# Patient Record
Sex: Female | Born: 1985 | Race: White | Hispanic: No | Marital: Single | Smoking: Current every day smoker
Health system: Southern US, Community
[De-identification: ages and names within clinical notes are randomized; demographics above are authoritative.]

## PROBLEM LIST (undated history)

## (undated) ENCOUNTER — Inpatient Hospital Stay: Payer: Self-pay

## (undated) DIAGNOSIS — J45909 Unspecified asthma, uncomplicated: Secondary | ICD-10-CM

## (undated) DIAGNOSIS — R51 Headache: Secondary | ICD-10-CM

## (undated) DIAGNOSIS — N3010 Interstitial cystitis (chronic) without hematuria: Secondary | ICD-10-CM

## (undated) DIAGNOSIS — F419 Anxiety disorder, unspecified: Secondary | ICD-10-CM

## (undated) DIAGNOSIS — O4400 Placenta previa specified as without hemorrhage, unspecified trimester: Secondary | ICD-10-CM

## (undated) DIAGNOSIS — D649 Anemia, unspecified: Secondary | ICD-10-CM

## (undated) DIAGNOSIS — R519 Headache, unspecified: Secondary | ICD-10-CM

## (undated) DIAGNOSIS — R011 Cardiac murmur, unspecified: Secondary | ICD-10-CM

## (undated) DIAGNOSIS — G2581 Restless legs syndrome: Secondary | ICD-10-CM

## (undated) HISTORY — PX: DILATION AND CURETTAGE OF UTERUS: SHX78

---
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
## 2006-01-26 ENCOUNTER — Emergency Department: Payer: Self-pay | Admitting: Emergency Medicine

## 2006-03-13 ENCOUNTER — Emergency Department: Payer: Self-pay | Admitting: Emergency Medicine

## 2006-09-16 ENCOUNTER — Emergency Department: Payer: Self-pay | Admitting: Unknown Physician Specialty

## 2007-02-10 ENCOUNTER — Other Ambulatory Visit: Payer: Self-pay

## 2007-02-10 ENCOUNTER — Emergency Department: Payer: Self-pay

## 2010-07-14 ENCOUNTER — Observation Stay: Payer: Self-pay | Admitting: Obstetrics and Gynecology

## 2010-07-15 ENCOUNTER — Inpatient Hospital Stay: Payer: Self-pay | Admitting: Obstetrics and Gynecology

## 2010-10-05 ENCOUNTER — Inpatient Hospital Stay: Payer: Self-pay

## 2010-10-14 ENCOUNTER — Emergency Department: Payer: Self-pay | Admitting: Emergency Medicine

## 2011-01-14 ENCOUNTER — Emergency Department: Payer: Self-pay | Admitting: Emergency Medicine

## 2011-09-04 ENCOUNTER — Emergency Department: Payer: Self-pay | Admitting: Emergency Medicine

## 2012-08-17 ENCOUNTER — Inpatient Hospital Stay: Payer: Self-pay

## 2012-08-17 DIAGNOSIS — O365990 Maternal care for other known or suspected poor fetal growth, unspecified trimester, not applicable or unspecified: Secondary | ICD-10-CM

## 2012-08-17 LAB — CBC WITH DIFFERENTIAL/PLATELET
Basophil %: 1.1 %
Eosinophil #: 0.4 10*3/uL (ref 0.0–0.7)
Lymphocyte %: 16.3 %
MCH: 26.7 pg (ref 26.0–34.0)
MCHC: 32.3 g/dL (ref 32.0–36.0)
Monocyte #: 1.3 x10 3/mm — ABNORMAL HIGH (ref 0.2–0.9)
Neutrophil #: 12.8 10*3/uL — ABNORMAL HIGH (ref 1.4–6.5)
RBC: 4.51 10*6/uL (ref 3.80–5.20)

## 2013-05-03 ENCOUNTER — Encounter (HOSPITAL_COMMUNITY): Payer: Self-pay

## 2013-05-03 ENCOUNTER — Emergency Department (HOSPITAL_COMMUNITY)
Admission: EM | Admit: 2013-05-03 | Discharge: 2013-05-03 | Disposition: A | Discharge status: Home / Self Care | Payer: Self-pay | Attending: Emergency Medicine | Admitting: Emergency Medicine

## 2013-05-03 DIAGNOSIS — F10920 Alcohol use, unspecified with intoxication, uncomplicated: Secondary | ICD-10-CM

## 2013-05-03 DIAGNOSIS — F1010 Alcohol abuse, uncomplicated: Secondary | ICD-10-CM | POA: Insufficient documentation

## 2013-05-03 DIAGNOSIS — N3010 Interstitial cystitis (chronic) without hematuria: Secondary | ICD-10-CM | POA: Insufficient documentation

## 2013-05-03 DIAGNOSIS — E162 Hypoglycemia, unspecified: Secondary | ICD-10-CM | POA: Insufficient documentation

## 2013-05-03 HISTORY — DX: Interstitial cystitis (chronic) without hematuria: N30.10

## 2013-05-03 MED ORDER — ONDANSETRON 8 MG PO TBDP
8.0000 mg | ORAL_TABLET | Freq: Once | ORAL | Status: AC
  Administered 2013-05-03: 8 mg via ORAL
  Filled 2013-05-03: qty 1

## 2013-05-03 NOTE — ED Provider Notes (Signed)
History    CSN: [id number] Arrival date & time 05/03/13  0865  First MD Initiated Contact with Patient 05/03/13 0344     Chief Complaint  Patient presents with  . Alcohol Intoxication   (Consider location/radiation/quality/duration/timing/severity/associated sxs/prior Treatment) HPI  Tokiko Diefenderfer is a 27 y.o. female brought in by EMS or patient was unable to stand up or open her eyes. CBG was 54 in the field. Patient states that she's had approximately 6 beers in 2-3 shots she has not eaten today. She normally does not drink alcohol. She endorses nausea, denies pain, emesis. Patient is AxOx3   Past Medical History  Diagnosis Date  . Interstitial cystitis    History reviewed. No pertinent past surgical history. History reviewed. No pertinent family history. History  Substance Use Topics  . Smoking status: Not on file  . Smokeless tobacco: Not on file  . Alcohol Use: Yes   OB History   Grav Para Term Preterm Abortions TAB SAB Ect Mult Living                 Review of Systems  Constitutional:       Negative except as described in HPI  HENT:       Negative except as described in HPI  Respiratory:       Negative except as described in HPI  Cardiovascular:       Negative except as described in HPI  Gastrointestinal:       Negative except as described in HPI  Genitourinary:       Negative except as described in HPI  Musculoskeletal:       Negative except as described in HPI  Skin:       Negative except as described in HPI  Neurological:       Negative except as described in HPI  All other systems reviewed and are negative.    Allergies  Amoxicillin  Home Medications  No current outpatient prescriptions on file. LMP 05/03/2013 Physical Exam  Nursing note and vitals reviewed. Constitutional: She is oriented to person, place, and time. She appears well-developed and well-nourished. No distress.  Appears disheveled, alert and oriented x3, no slurring of  speech.  HENT:  Head: Normocephalic and atraumatic.  Mouth/Throat: Oropharynx is clear and moist.  Eyes: Conjunctivae and EOM are normal. Pupils are equal, round, and reactive to light.  Neck: Normal range of motion. Neck supple.  No midline tenderness to palpation or step-offs appreciated. Patient has full range of motion without pain.    Cardiovascular: Normal rate, regular rhythm and intact distal pulses.   Pulmonary/Chest: Effort normal and breath sounds normal. No stridor. No respiratory distress. She has no wheezes. She has no rales. She exhibits no tenderness.  Abdominal: Soft. Bowel sounds are normal. She exhibits no distension and no mass. There is no tenderness. There is no rebound and no guarding.  Musculoskeletal: Normal range of motion. She exhibits no edema.  Neurological: She is alert and oriented to person, place, and time.  Psychiatric: She has a normal mood and affect.    ED Course  Procedures (including critical care time) Labs Reviewed - No data to display No results found. 1. Alcohol intoxication, uncomplicated   2. Hypoglycemia     MDM   Filed Vitals:   05/03/13 0359  Temp: 97.5 F (36.4 C)  TempSrc: Oral     Leimomi Zervas is a 27 y.o. female with hypoglycemia and alcohol intoxication. Patient is coherent, ambulates with a  coordinated gait. Sugar is normalized at this point, politely requesting to be discharged to home. Does not appear to be a threat to herself or others. Her sister will be coming to pick her up  Medications  ondansetron (ZOFRAN-ODT) disintegrating tablet 8 mg (8 mg Oral Given 05/03/13 0358)    Pt is hemodynamically stable, appropriate for, and amenable to discharge at this time. Pt verbalized understanding and agrees with care plan. Outpatient follow-up and specific return precautions discussed.     Joni Reining Ermine Spofford, PA-C 05/03/13 0403

## 2013-05-03 NOTE — ED Provider Notes (Signed)
Medical screening examination/treatment/procedure(s) were performed by non-physician practitioner and as supervising physician I was immediately available for consultation/collaboration.  Lyanne Co, MD 05/03/13 [phone]

## 2013-05-03 NOTE — ED Notes (Signed)
Bed:WA18<BR> Expected date:<BR> Expected time:<BR> Means of arrival:<BR> Comments:<BR> EMS

## 2013-05-03 NOTE — ED Notes (Signed)
Pt was at [facility] and was unable to stand up or open her eyes, the bouncers called [employer] and they called EMS, pt states that she's from out of town and hasn't eaten all day. Per EMS CBG was 54

## 2013-07-31 ENCOUNTER — Emergency Department: Payer: Self-pay | Admitting: Emergency Medicine

## 2013-09-12 ENCOUNTER — Emergency Department: Payer: Self-pay | Admitting: Emergency Medicine

## 2013-09-12 LAB — URINALYSIS, COMPLETE
Ketone: NEGATIVE
Specific Gravity: 1.017 (ref 1.003–1.030)
Squamous Epithelial: 7
WBC UR: 359 /HPF (ref 0–5)

## 2013-09-15 LAB — URINE CULTURE

## 2013-09-16 ENCOUNTER — Emergency Department: Payer: Self-pay | Admitting: Emergency Medicine

## 2013-09-17 LAB — CBC
HCT: 38.3 % (ref 35.0–47.0)
HGB: 12.5 g/dL (ref 12.0–16.0)
MCH: 25.8 pg — ABNORMAL LOW (ref 26.0–34.0)
MCHC: 32.7 g/dL (ref 32.0–36.0)
Platelet: 403 10*3/uL (ref 150–440)
WBC: 16 10*3/uL — ABNORMAL HIGH (ref 3.6–11.0)

## 2013-09-17 LAB — COMPREHENSIVE METABOLIC PANEL
Albumin: 4 g/dL (ref 3.4–5.0)
Alkaline Phosphatase: 89 U/L (ref 50–136)
Anion Gap: 3 — ABNORMAL LOW (ref 7–16)
BUN: 9 mg/dL (ref 7–18)
Bilirubin,Total: 0.2 mg/dL (ref 0.2–1.0)
Creatinine: 0.94 mg/dL (ref 0.60–1.30)
EGFR (African American): >60
Potassium: 3.5 mmol/L (ref 3.5–5.1)
Sodium: 137 mmol/L (ref 136–145)

## 2013-09-17 LAB — URINALYSIS, COMPLETE
Bilirubin,UR: NEGATIVE
Blood: NEGATIVE
Glucose,UR: NEGATIVE mg/dL (ref 0–75)
Leukocyte Esterase: NEGATIVE
Specific Gravity: 1.017 (ref 1.003–1.030)
WBC UR: 4 /HPF (ref 0–5)

## 2013-09-18 LAB — URINE CULTURE

## 2014-01-17 ENCOUNTER — Emergency Department: Payer: Self-pay | Admitting: Emergency Medicine

## 2014-01-17 LAB — URINALYSIS, COMPLETE
Bilirubin,UR: NEGATIVE
KETONE: NEGATIVE
LEUKOCYTE ESTERASE: NEGATIVE
Nitrite: POSITIVE
Ph: 5 (ref 4.5–8.0)
Protein: 100
RBC,UR: 429 /HPF (ref 0–5)
Specific Gravity: 1.015 (ref 1.003–1.030)
WBC UR: 509 /HPF (ref 0–5)

## 2014-01-20 LAB — URINE CULTURE

## 2014-08-08 ENCOUNTER — Emergency Department: Payer: Self-pay | Admitting: Emergency Medicine

## 2014-08-08 LAB — URINALYSIS, COMPLETE
Bilirubin,UR: NEGATIVE
Glucose,UR: NEGATIVE mg/dL (ref 0–75)
KETONE: NEGATIVE
LEUKOCYTE ESTERASE: NEGATIVE
NITRITE: POSITIVE
PROTEIN: NEGATIVE
Ph: 6 (ref 4.5–8.0)
Specific Gravity: 1.019 (ref 1.003–1.030)
Squamous Epithelial: 4

## 2014-08-10 LAB — URINE CULTURE

## 2014-10-30 ENCOUNTER — Emergency Department: Payer: Self-pay | Admitting: Emergency Medicine

## 2014-10-31 ENCOUNTER — Emergency Department: Payer: Self-pay | Admitting: Emergency Medicine

## 2015-02-23 NOTE — Discharge Summary (Signed)
PATIENT NAME:  Jordan [name], Jordan [name] MR#:  [id number] DATE OF BIRTH:  20-Feb-1986  DATE OF ADMISSION:  08/17/2012 DATE OF DISCHARGE:  08/20/2012  HOSPITAL COURSE: The patient underwent elective repeat Cesarean section which was uncomplicated. Postop day #1 hematocrit 27.2%. She is discharged to home on postop day #3 in stable condition. She will follow-up with Dr. Feliberto GottronSchermerhorn in two weeks in the clinic.   DISCHARGE MEDICATIONS:  1. Norco. 2. Ibuprofen. 3. Iron. 4. Prenatal vitamins.   DISCHARGE INSTRUCTIONS: She is given precautions to return before two weeks if she has wound drainage, fever, nausea, or vomiting.   ____________________________ Suzy Bouchardhomas J. Terrance Lanahan, MD tjs:drc [name]: 08/23/2012 08:23:36 ET T: 08/23/2012 11:22:14 ET JOB#: [id number]  cc: Suzy Bouchardhomas J. Shamell Suarez, MD, <Dictator> Suzy BouchardHOMAS J Tylasia Fletchall MD ELECTRONICALLY SIGNED 08/24/2012 10:30

## 2015-02-23 NOTE — Op Note (Signed)
PATIENT NAME:  Jordan [name], Jordan [name] MR#:  [id number] DATE OF BIRTH:  [birth year]  DATE OF PROCEDURE:  08/17/2012  PREOPERATIVE DIAGNOSES:  1. Labor.  2. Previous cesarean section.   POSTOPERATIVE DIAGNOSES: 1. Labor.  2. Previous cesarean section.   PROCEDURE: Repeat low transverse cesarean section.   SURGEON: Suzy Bouchardhomas J. Schermerhorn, [name].   FIRST ASSISTANT: Yetta BarreJones  ANESTHESIA: Spinal.   INDICATIONS: This is a 29 year old gravida 3, para 1, a patient at 8037 weeks estimated gestational age who came in in active labor. The patient elects for repeat cesarean section.   DESCRIPTION OF PROCEDURE: After adequate spinal anesthesia, the patient was placed in the dorsal supine position with a hip roll under the right side. The patient's abdomen was prepped and draped in normal sterile fashion. A Pfannenstiel incision was made two fingerbreadths above the symphysis pubis. Sharp dissection was used to identify the fascia. The fascia was opened in the midline and opened in a transverse fashion. The superior aspect of the fascia was grasped with Kocher clamps and the recti muscles dissected free. The inferior aspect of the fascia was grasped with Kocher clamps and pyramidalis muscle was dissected free. Entry into the peritoneal cavity was accomplished sharply. The vesicouterine peritoneal fold was identified. A bladder flap was created and the bladder was reflected inferiorly. A low transverse uterine incision was made. Upon entry into the endometrial cavity, clear fluid resulted. The incision was extended with blunt transverse traction. Fetal head was delivered through the incision and body was delivered without difficulty. The cord was doubly clamped. A vigorous small female was passed to Dr. Beckie Saltsasnadi who assigned Apgar scores of 9 and 9. Placenta was manually delivered and the uterus was exteriorized and the endometrial cavity was wiped clean with laparotomy tape. The cervix was opened with a ring forceps and this  was passed off the operative field. The uterine incision was then closed with one chromic suture in a running locking fashion with good approximation of edges. Good hemostasis was noted. The fallopian tubes and ovaries appeared normal. The posterior cul-de-sac was irrigated and suctioned. The uterus was placed back into the abdominal cavity. Again, the uterine incision appeared hemostatic. Interceed was placed over the uterine incision, in a T-shaped fashion. The superior aspect of the fascia was grasped with Kocher clamps and the On-Q pump system was brought up to the operative field. Catheters were inserted infraumbilically and subfascially. The fascia was then closed over top of the catheters with 0 Vicryl suture. Subcutaneous tissues were irrigated and bovied for hemostasis. The skin was reapproximated with staples. On-Q pump catheters were secured with Dermabond, at the skin level, and Steri-Strips applied to the catheters and Tegaderm applied above this. Each catheter was loaded with 5 mL of 0.5% Marcaine. There were no complications. Estimated blood loss 500 mL. Intraoperative fluids 1500 mL. The patient tolerated the procedure well and was taken to the recovery room in good condition. ____________________________ Suzy Bouchardhomas J. Schermerhorn, MD tjs:slb [name]: 08/17/2012 15:05:30 ET T: 08/17/2012 15:19:19 ET JOB#: [id number]  cc: Suzy Bouchardhomas J. Schermerhorn, MD, <Dictator> Suzy BouchardHOMAS J SCHERMERHORN MD ELECTRONICALLY SIGNED 08/19/2012 9:27

## 2015-03-16 NOTE — H&P (Signed)
L&D Evaluation:  History:   HPI 29 yo G3P1011 with LMP of 09/08/12 by [place] at 28 weeks with late entry to care at 28 weeks, Prev C-section due to cord prolapse and smoker 10 cigs a day. GC/CH is not on current chart. UC's began this am and pt had cx exam yest and was closed in the office by CJones, cNm. Today, she is 3-4 cms in active labor. No ROM, VB or decreased FM. Pt is crying due to IV being started.    Presents with contractions    Patient's Medical History Pre-ecclampsia with Primary C-section at 39 weeks, asthma,    Patient's Surgical History Previous C-Section    Medications Pre Natal Vitamins    Allergies Amoxicillen    Social History tobacco  8-10 scale    Family History Non-Contributory   ROS:   ROS All systems were reviewed.  HEENT, CNS, GI, GU, Respiratory, CV, Renal and Musculoskeletal systems were found to be normal.   Exam:   Vital Signs stable    General no apparent distress    Mental Status clear    Chest clear    Heart no murmur/gallop/rubs    Abdomen gravid, non-tender    Estimated Fetal Weight Small for gestational age    Back no CVAT    Edema 1+    Reflexes 1+    Pelvic 3-4 cms    Mebranes Intact    FHT normal rate with no decels, 1 decel to 80 x 20 secs    Ucx regular    Skin dry    Lymph no lymphadenopathy   Impression:   Impression active labor   Plan:   Plan monitor contractions and for cervical change    Comments Planning to call a C-section however, another pt on the unit is having decels. May give Terb if needed.   Electronic Signatures: Sharee PimpleJones, Rease Swinson W (CNM)  (Signed 12-Oct-13 13:33)  Authored: L&D Evaluation   Last Updated: 12-Oct-13 13:33 by Sharee PimpleJones, Sheala Dosh W (CNM)

## 2015-07-22 ENCOUNTER — Emergency Department
Admission: EM | Admit: 2015-07-22 | Discharge: 2015-07-22 | Disposition: A | Discharge status: Home / Self Care | Payer: Medicaid Other | Source: Home / Self Care

## 2015-07-22 ENCOUNTER — Encounter: Payer: Self-pay | Admitting: Emergency Medicine

## 2015-07-22 ENCOUNTER — Emergency Department
Admission: EM | Admit: 2015-07-22 | Discharge: 2015-07-22 | Disposition: A | Discharge status: Home / Self Care | Payer: Medicaid Other | Attending: Emergency Medicine | Admitting: Emergency Medicine

## 2015-07-22 DIAGNOSIS — O210 Mild hyperemesis gravidarum: Secondary | ICD-10-CM | POA: Diagnosis not present

## 2015-07-22 DIAGNOSIS — Z3A08 8 weeks gestation of pregnancy: Secondary | ICD-10-CM | POA: Insufficient documentation

## 2015-07-22 DIAGNOSIS — F17210 Nicotine dependence, cigarettes, uncomplicated: Secondary | ICD-10-CM | POA: Insufficient documentation

## 2015-07-22 DIAGNOSIS — Z3491 Encounter for supervision of normal pregnancy, unspecified, first trimester: Secondary | ICD-10-CM

## 2015-07-22 DIAGNOSIS — O99331 Smoking (tobacco) complicating pregnancy, first trimester: Secondary | ICD-10-CM | POA: Insufficient documentation

## 2015-07-22 DIAGNOSIS — R102 Pelvic and perineal pain: Secondary | ICD-10-CM | POA: Diagnosis not present

## 2015-07-22 DIAGNOSIS — O9989 Other specified diseases and conditions complicating pregnancy, childbirth and the puerperium: Secondary | ICD-10-CM | POA: Diagnosis present

## 2015-07-22 DIAGNOSIS — Z880 Allergy status to penicillin: Secondary | ICD-10-CM | POA: Insufficient documentation

## 2015-07-22 LAB — URINALYSIS COMPLETE WITH MICROSCOPIC (ARMC ONLY)
Bilirubin Urine: NEGATIVE
GLUCOSE, UA: NEGATIVE mg/dL
HGB URINE DIPSTICK: NEGATIVE
KETONES UR: NEGATIVE mg/dL
NITRITE: NEGATIVE
Protein, ur: 30 mg/dL — AB
SPECIFIC GRAVITY, URINE: 1.024 (ref 1.005–1.030)
pH: 6 (ref 5.0–8.0)

## 2015-07-22 LAB — COMPREHENSIVE METABOLIC PANEL
ALT: 17 U/L (ref 14–54)
ANION GAP: 9 (ref 5–15)
AST: 21 U/L (ref 15–41)
Albumin: 4 g/dL (ref 3.5–5.0)
Alkaline Phosphatase: 50 U/L (ref 38–126)
BILIRUBIN TOTAL: 0.4 mg/dL (ref 0.3–1.2)
BUN: 8 mg/dL (ref 6–20)
CALCIUM: 9.3 mg/dL (ref 8.9–10.3)
CO2: 21 mmol/L — ABNORMAL LOW (ref 22–32)
Chloride: 105 mmol/L (ref 101–111)
Creatinine, Ser: 0.59 mg/dL (ref 0.44–1.00)
GFR calc Af Amer: >60 mL/min (ref >60)
Glucose, Bld: 92 mg/dL (ref 65–99)
POTASSIUM: 3.5 mmol/L (ref 3.5–5.1)
Sodium: 135 mmol/L (ref 135–145)
TOTAL PROTEIN: 7.4 g/dL (ref 6.5–8.1)

## 2015-07-22 LAB — CBC
HEMATOCRIT: 35.3 % (ref 35.0–47.0)
HEMOGLOBIN: 11.6 g/dL — AB (ref 12.0–16.0)
MCH: 27.1 pg (ref 26.0–34.0)
MCHC: 32.9 g/dL (ref 32.0–36.0)
MCV: 82.5 fL (ref 80.0–100.0)
Platelets: 227 10*3/uL (ref 150–440)
RBC: 4.27 MIL/uL (ref 3.80–5.20)
RDW: 13 % (ref 11.5–14.5)
WBC: 11.5 10*3/uL — AB (ref 3.6–11.0)

## 2015-07-22 LAB — POCT PREGNANCY, URINE: PREG TEST UR: POSITIVE — AB

## 2015-07-22 LAB — HCG, QUANTITATIVE, PREGNANCY: hCG, Beta Chain, Quant, S: 242265 m[IU]/mL — ABNORMAL HIGH (ref <5)

## 2015-07-22 MED ORDER — PRENATAL VITAMINS 0.8 MG PO TABS: 1.0000 | ORAL_TABLET | Freq: Every day | ORAL | Status: DC

## 2015-07-22 MED ORDER — PROMETHAZINE HCL 25 MG PO TABS: 12.5000 mg | ORAL_TABLET | Freq: Four times a day (QID) | ORAL | Status: DC | PRN

## 2015-07-22 NOTE — ED Notes (Signed)
Pt states she has been shaky with headache and dizziness, states "I feel like my throat is closing", also states she has been having some lower abd. Pain with nausea, states abd. Pain started 2 weeks ago, denies any urinary symptoms

## 2015-07-22 NOTE — Discharge Instructions (Signed)
Your evaluation in the ER today reveals that you are pregnant.  Your last menstrual period, you are 8 weeks and 2 days along. Your pregnancy hormone level today is [phone].  Please follow-up with the health Department or your obstetrician to establish prenatal care and take prenatal vitamins.  First Trimester of Pregnancy The first trimester of pregnancy is from week 1 until the end of week 12 (months 1 through 3). A week after a sperm fertilizes an egg, the egg will implant on the wall of the uterus. This embryo will begin to develop into a baby. Genes from you and your partner are forming the baby. The female genes determine whether the baby is a boy or a girl. At 6-8 weeks, the eyes and face are formed, and the heartbeat can be seen on ultrasound. At the end of 12 weeks, all the baby's organs are formed.  Now that you are pregnant, you will want to do everything you can to have a healthy baby. Two of the most important things are to get good prenatal care and to follow your health care provider's instructions. Prenatal care is all the medical care you receive before the baby's birth. This care will help prevent, find, and treat any problems during the pregnancy and childbirth. BODY CHANGES Your body goes through many changes during pregnancy. The changes vary from woman to woman.   You may gain or lose a couple of pounds at first.  You may feel sick to your stomach (nauseous) and throw up (vomit). If the vomiting is uncontrollable, call your health care provider.  You may tire easily.  You may develop headaches that can be relieved by medicines approved by your health care provider.  You may urinate more often. Painful urination may mean you have a bladder infection.  You may develop heartburn as a result of your pregnancy.  You may develop constipation because certain hormones are causing the muscles that push waste through your intestines to slow down.  You may develop hemorrhoids or  swollen, bulging veins (varicose veins).  Your breasts may begin to grow larger and become tender. Your nipples may stick out more, and the tissue that surrounds them (areola) may become darker.  Your gums may bleed and may be sensitive to brushing and flossing.  Dark spots or blotches (chloasma, mask of pregnancy) may develop on your face. This will likely fade after the baby is born.  Your menstrual periods will stop.  You may have a loss of appetite.  You may develop cravings for certain kinds of food.  You may have changes in your emotions from day to day, such as being excited to be pregnant or being concerned that something may go wrong with the pregnancy and baby.  You may have more vivid and strange dreams.  You may have changes in your hair. These can include thickening of your hair, rapid growth, and changes in texture. Some women also have hair loss during or after pregnancy, or hair that feels dry or thin. Your hair will most likely return to normal after your baby is born. WHAT TO EXPECT AT YOUR PRENATAL VISITS During a routine prenatal visit:  You will be weighed to make sure you and the baby are growing normally.  Your blood pressure will be taken.  Your abdomen will be measured to track your baby's growth.  The fetal heartbeat will be listened to starting around week 10 or 12 of your pregnancy.  Test results from any previous  visits will be discussed. Your health care provider may ask you:  How you are feeling.  If you are feeling the baby move.  If you have had any abnormal symptoms, such as leaking fluid, bleeding, severe headaches, or abdominal cramping.  If you have any questions. Other tests that may be performed during your first trimester include:  Blood tests to find your blood type and to check for the presence of any previous infections. They will also be used to check for low iron levels (anemia) and Rh antibodies. Later in the pregnancy, blood  tests for diabetes will be done along with other tests if problems develop.  Urine tests to check for infections, diabetes, or protein in the urine.  An ultrasound to confirm the proper growth and development of the baby.  An amniocentesis to check for possible genetic problems.  Fetal screens for spina bifida and Down syndrome.  You may need other tests to make sure you and the baby are doing well. HOME CARE INSTRUCTIONS  Medicines  Follow your health care provider's instructions regarding medicine use. Specific medicines may be either safe or unsafe to take during pregnancy.  Take your prenatal vitamins as directed.  If you develop constipation, try taking a stool softener if your health care provider approves. Diet  Eat regular, well-balanced meals. Choose a variety of foods, such as meat or vegetable-based protein, fish, milk and low-fat dairy products, vegetables, fruits, and whole grain breads and cereals. Your health care provider will help you determine the amount of weight gain that is right for you.  Avoid raw meat and uncooked cheese. These carry germs that can cause birth defects in the baby.  Eating four or five small meals rather than three large meals a day may help relieve nausea and vomiting. If you start to feel nauseous, eating a few soda crackers can be helpful. Drinking liquids between meals instead of during meals also seems to help nausea and vomiting.  If you develop constipation, eat more high-fiber foods, such as fresh vegetables or fruit and whole grains. Drink enough fluids to keep your urine clear or pale yellow. Activity and Exercise  Exercise only as directed by your health care provider. Exercising will help you:  Control your weight.  Stay in shape.  Be prepared for labor and delivery.  Experiencing pain or cramping in the lower abdomen or low back is a good sign that you should stop exercising. Check with your health care provider before  continuing normal exercises.  Try to avoid standing for long periods of time. Move your legs often if you must stand in one place for a long time.  Avoid heavy lifting.  Wear low-heeled shoes, and practice good posture.  You may continue to have sex unless your health care provider directs you otherwise. Relief of Pain or Discomfort  Wear a good support bra for breast tenderness.   Take warm sitz baths to soothe any pain or discomfort caused by hemorrhoids. Use hemorrhoid cream if your health care provider approves.   Rest with your legs elevated if you have leg cramps or low back pain.  If you develop varicose veins in your legs, wear support hose. Elevate your feet for 15 minutes, 3-4 times a day. Limit salt in your diet. Prenatal Care  Schedule your prenatal visits by the twelfth week of pregnancy. They are usually scheduled monthly at first, then more often in the last 2 months before delivery.  Write down your questions. Take them  to your prenatal visits.  Keep all your prenatal visits as directed by your health care provider. Safety  Wear your seat belt at all times when driving.  Make a list of emergency phone numbers, including numbers for family, friends, the hospital, and police and fire departments. General Tips  Ask your health care provider for a referral to a local prenatal education class. Begin classes no later than at the beginning of month 6 of your pregnancy.  Ask for help if you have counseling or nutritional needs during pregnancy. Your health care provider can offer advice or refer you to specialists for help with various needs.  Do not use hot tubs, steam rooms, or saunas.  Do not douche or use tampons or scented sanitary pads.  Do not cross your legs for long periods of time.  Avoid cat litter boxes and soil used by cats. These carry germs that can cause birth defects in the baby and possibly loss of the fetus by miscarriage or stillbirth.  Avoid  all smoking, herbs, alcohol, and medicines not prescribed by your health care provider. Chemicals in these affect the formation and growth of the baby.  Schedule a dentist appointment. At home, brush your teeth with a soft toothbrush and be gentle when you floss. SEEK MEDICAL CARE IF:   You have dizziness.  You have mild pelvic cramps, pelvic pressure, or nagging pain in the abdominal area.  You have persistent nausea, vomiting, or diarrhea.  You have a bad smelling vaginal discharge.  You have pain with urination.  You notice increased swelling in your face, hands, legs, or ankles. SEEK IMMEDIATE MEDICAL CARE IF:   You have a fever.  You are leaking fluid from your vagina.  You have spotting or bleeding from your vagina.  You have severe abdominal cramping or pain.  You have rapid weight gain or loss.  You vomit blood or material that looks like coffee grounds.  You are exposed to [place] measles and have never had them.  You are exposed to fifth disease or chickenpox.  You develop a severe headache.  You have shortness of breath.  You have any kind of trauma, such as from a fall or a car accident. Document Released: 10/17/2001 Document Revised: 03/09/2014 Document Reviewed: 09/02/2013 [facility] Patient Information 2015 [place], [place]. This information is not intended to replace advice given to you by your health care provider. Make sure you discuss any questions you have with your health care provider.  Morning Sickness Morning sickness is when you feel sick to your stomach (nauseous) during pregnancy. This nauseous feeling may or may not come with vomiting. It often occurs in the morning but can be a problem any time of day. Morning sickness is most common during the first trimester, but it may continue throughout pregnancy. While morning sickness is unpleasant, it is usually harmless unless you develop severe and continual vomiting (hyperemesis gravidarum). This  condition requires more intense treatment.  CAUSES  The cause of morning sickness is not completely known but seems to be related to normal hormonal changes that occur in pregnancy. RISK FACTORS You are at greater risk if you:  Experienced nausea or vomiting before your pregnancy.  Had morning sickness during a previous pregnancy.  Are pregnant with more than one baby, such as twins. TREATMENT  Do not use any medicines (prescription, over-the-counter, or herbal) for morning sickness without first talking to your health care provider. Your health care provider may prescribe or recommend:  Vitamin B6  supplements.  Anti-nausea medicines.  The herbal medicine ginger. HOME CARE INSTRUCTIONS   Only take over-the-counter or prescription medicines as directed by your health care provider.  Taking multivitamins before getting pregnant can prevent or decrease the severity of morning sickness in most women.  Eat a piece of dry toast or unsalted crackers before getting out of bed in the morning.  Eat five or six small meals a day.  Eat dry and bland foods (rice, baked potato). Foods high in carbohydrates are often helpful.  Do not drink liquids with your meals. Drink liquids between meals.  Avoid greasy, fatty, and spicy foods.  Get someone to cook for you if the smell of any food causes nausea and vomiting.  If you feel nauseous after taking prenatal vitamins, take the vitamins at night or with a snack.  Snack on protein foods (nuts, yogurt, cheese) between meals if you are hungry.  Eat unsweetened gelatins for desserts.  Wearing an acupressure wristband (worn for sea sickness) may be helpful.  Acupuncture may be helpful.  Do not smoke.  Get a humidifier to keep the air in your house free of odors.  Get plenty of fresh air. SEEK MEDICAL CARE IF:   Your home remedies are not working, and you need medicine.  You feel dizzy or lightheaded.  You are losing weight. SEEK  IMMEDIATE MEDICAL CARE IF:   You have persistent and uncontrolled nausea and vomiting.  You pass out (faint). MAKE SURE YOU:  Understand these instructions.  Will watch your condition.  Will get help right away if you are not doing well or get worse. Document Released: 12/14/2006 Document Revised: 10/28/2013 Document Reviewed: 04/09/2013 [facility] Patient Information 2015 [place], [place]. This information is not intended to replace advice given to you by your health care provider. Make sure you discuss any questions you have with your health care provider.

## 2015-07-22 NOTE — ED Notes (Signed)
Pt states nausea, dizziness and weakness, states yesterday she had a headache and some left arm numbness, pt awake and alert in no distress ambulatory to room

## 2015-07-22 NOTE — ED Notes (Signed)
Pt 4 times in waiting room, no answer

## 2015-07-22 NOTE — ED Notes (Signed)
Pt returned back to ED after leaving, pt not having any shakiness or anxiety at this time but still having lower abd. pain

## 2015-07-22 NOTE — ED Provider Notes (Signed)
[facility] Emergency Department Provider Note  ____________________________________________  Time seen: 6:30 PM  I have reviewed the triage vital signs and the nursing notes.   HISTORY  Chief Complaint Abdominal Pain    HPI Jordan [name] is a 29 y.o. female who complains of nausea vomiting and dizziness over the past few days. This seemed to come in and over. Denies any recent illness. No chest pain shortness breath fever chills. No vomiting or diarrhea. Last menstrual. Was 05/25/2015.  Denies dysuria frequency urgency. No vaginal bleeding or discharge. Complains of some mild suprapubic discomfort. Nonradiating   Past Medical History  Diagnosis Date  . Interstitial cystitis      There are no active problems to display for this patient.    Past Surgical History  Procedure Laterality Date  . Cesarean section       Current Outpatient Rx  Name  Route  Sig  Dispense  Refill  . Prenatal Multivit-Min-Fe-FA (PRENATAL VITAMINS) 0.8 MG tablet   Oral   Take 1 tablet by mouth daily.   10 tablet   0   . promethazine (PHENERGAN) 25 MG tablet   Oral   Take 0.5 tablets (12.5 mg total) by mouth every 6 (six) hours as needed for nausea or vomiting.   10 tablet   0      Allergies Amoxicillin and Vicodin   No family history on file.  Social History Social History  Substance Use Topics  . Smoking status: Current Every Day Smoker    Types: Cigarettes  . Smokeless tobacco: None  . Alcohol Use: No    Review of Systems  Constitutional:   No fever or chills. No weight changes Eyes:   No blurry vision or double vision.  ENT:   No sore throat. Cardiovascular:   No chest pain. Respiratory:   No dyspnea or cough. Gastrointestinal:   Mild pelvic pain with nausea, no vomiting or diarrhea.  No BRBPR or melena. Genitourinary:   Negative for dysuria, urinary retention, bloody urine, or difficulty urinating. Musculoskeletal:   Negative for back  pain. No joint swelling or pain. Skin:   Negative for rash. Neurological:   Negative for headaches, focal weakness or numbness. Psychiatric:  No anxiety or depression.   Endocrine:  No hot/cold intolerance, changes in energy, or sleep difficulty.  10-point ROS otherwise negative.  ____________________________________________   PHYSICAL EXAM:  VITAL SIGNS: ED Triage Vitals  Enc Vitals Group     BP 07/22/15 1815 115/76 mmHg     Pulse Rate 07/22/15 1815 84     Resp 07/22/15 1815 18     Temp 07/22/15 1815 98.1 F (36.7 C)     Temp Source 07/22/15 1815 Oral     SpO2 07/22/15 1815 98 %     Weight 07/22/15 1815 98 lb (44.453 kg)     Height 07/22/15 1815 5\' 4"  (1.626 m)     Head Cir --      Peak Flow --      Pain Score 07/22/15 1816 6     Pain Loc --      Pain Edu? --      Excl. in GC? --      Constitutional:   Alert and oriented. Well appearing and in no distress. Eyes:   No scleral icterus. No conjunctival pallor. PERRL. EOMI ENT   Head:   Normocephalic and atraumatic.   Nose:   No congestion/rhinnorhea. No septal hematoma   Mouth/Throat:   MMM, no  pharyngeal erythema. No peritonsillar mass. No uvula shift.   Neck:   No stridor. No SubQ emphysema. No meningismus. Hematological/Lymphatic/Immunilogical:   No cervical lymphadenopathy. Cardiovascular:   RRR. Normal and symmetric distal pulses are present in all extremities. No murmurs, rubs, or gallops. Respiratory:   Normal respiratory effort without tachypnea nor retractions. Breath sounds are clear and equal bilaterally. No wheezes/rales/rhonchi. Gastrointestinal:   Soft and nontender. No distention. There is no CVA tenderness.  No rebound, rigidity, or guarding.uterus nonpalpable, nongravid by exam Genitourinary:   deferred Musculoskeletal:   Nontender with normal range of motion in all extremities. No joint effusions.  No lower extremity tenderness.  No edema. Neurologic:   Normal speech and language.  CN  2-10 normal. Motor grossly intact. No pronator drift.  Normal gait. No gross focal neurologic deficits are appreciated.  Skin:    Skin is warm, dry and intact. No rash noted.  No petechiae, purpura, or bullae. Psychiatric:   Mood and affect are normal. Speech and behavior are normal. Patient exhibits appropriate insight and judgment.  ____________________________________________    LABS (pertinent positives/negatives) (all labs ordered are listed, but only abnormal results are displayed) Labs Reviewed - No data to display ____________________________________________   EKG    ____________________________________________    RADIOLOGY    ____________________________________________   PROCEDURES   ____________________________________________   INITIAL IMPRESSION / ASSESSMENT AND PLAN / ED COURSE  Pertinent labs & imaging results that were available during my care of the patient were reviewed by me and considered in my medical decision making (see chart for details).  Patient presents with a constellation of symptoms, found to be pregnant. This appears to be morning sickness that she is experiencing. Low suspicion for any complication. No evidence of intracranial thrombosis stroke or hemorrhage. Her neuro exam is normal now .results discussed with her, she'll follow up with the health department. I wrote her prescriptions for promethazine and vitamins. The "rare"Bacteriuria on urinalysis I think does not warrant treatment as asymptomatic bacteriuria in pregnancy.     ____________________________________________   FINAL CLINICAL IMPRESSION(S) / ED DIAGNOSES  Final diagnoses:  Morning sickness  First trimester pregnancy      Sharman Cheek, MD 07/22/15 1914

## 2015-08-13 ENCOUNTER — Other Ambulatory Visit: Payer: Self-pay | Admitting: Obstetrics and Gynecology

## 2015-08-13 DIAGNOSIS — Z369 Encounter for antenatal screening, unspecified: Secondary | ICD-10-CM

## 2015-08-23 ENCOUNTER — Ambulatory Visit (HOSPITAL_BASED_OUTPATIENT_CLINIC_OR_DEPARTMENT_OTHER)
Admission: RE | Admit: 2015-08-23 | Discharge: 2015-08-23 | Disposition: A | Discharge status: Home / Self Care | Payer: Medicaid Other | Source: Ambulatory Visit

## 2015-08-23 ENCOUNTER — Ambulatory Visit
Admission: RE | Admit: 2015-08-23 | Discharge: 2015-08-23 | Disposition: A | Discharge status: Home / Self Care | Payer: Medicaid Other | Source: Ambulatory Visit | Attending: Maternal & Fetal Medicine | Admitting: Maternal & Fetal Medicine

## 2015-08-23 VITALS — BP 103/42 | HR 78 | Temp 98.2°F | Wt 100.0 lb

## 2015-08-23 DIAGNOSIS — Z36 Encounter for antenatal screening of mother: Secondary | ICD-10-CM | POA: Diagnosis not present

## 2015-08-23 DIAGNOSIS — O99332 Smoking (tobacco) complicating pregnancy, second trimester: Secondary | ICD-10-CM | POA: Insufficient documentation

## 2015-08-23 DIAGNOSIS — Z3A14 14 weeks gestation of pregnancy: Secondary | ICD-10-CM | POA: Insufficient documentation

## 2015-08-23 DIAGNOSIS — Z3480 Encounter for supervision of other normal pregnancy, unspecified trimester: Secondary | ICD-10-CM | POA: Insufficient documentation

## 2015-08-23 DIAGNOSIS — Z369 Encounter for antenatal screening, unspecified: Secondary | ICD-10-CM | POA: Insufficient documentation

## 2015-08-23 DIAGNOSIS — Z31438 Encounter for other genetic testing of female for procreative management: Secondary | ICD-10-CM | POA: Insufficient documentation

## 2015-08-23 HISTORY — DX: Cardiac murmur, unspecified: R01.1

## 2015-08-23 NOTE — Progress Notes (Addendum)
[facility]  [facility] Length of Consultation: 40 minutes   Jordan [name]  [facility] counseling to review prenatal screening and testing options.  This note summarizes the information we discussed.    First trimester screening, which can include nuchal translucency ultrasound screen and/or first trimester maternal serum marker screening.  The nuchal translucency has approximately an 80% detection rate for Down syndrome and can be positive for other chromosome abnormalities as well as congenital heart defects.  When combined with a maternal serum marker screening, the detection rate is up to 90% for Down syndrome and up to 97% for trisomy 18.     Maternal serum marker screening, a blood test that measures pregnancy proteins, can provide risk assessments for Down syndrome, trisomy 18, and open neural tube defects (spina bifida, anencephaly). Because it does not directly examine the fetus, it cannot positively diagnose or rule out these problems.  Targeted ultrasound uses high frequency sound waves to create an image of the developing fetus.  An ultrasound is often recommended as a routine means of evaluating the pregnancy.  It is also used to screen for fetal anatomy problems (for example, a heart defect) that might be suggestive of a chromosomal or other abnormality.   Should these screening tests indicate an increased concern, then the following testing options would be offered:  The chorionic villus sampling procedure is available for first trimester chromosome analysis.  This involves the withdrawal of a small amount of chorionic villi (tissue from the developing placenta).  Risk of pregnancy loss is estimated to be approximately 1 in 200 to 1 in 100 (0.5 to 1%).  There is approximately a 1% (1 in 100) chance that the CVS chromosome results will be unclear.  Chorionic villi cannot be tested for neural tube defects.      Amniocentesis involves the removal of a small amount of amniotic fluid from the sac surrounding the fetus with the use of a thin needle inserted through the maternal abdomen and uterus.  Ultrasound guidance is used throughout the procedure.  Fetal cells from amniotic fluid are directly evaluated and > 99.5% of chromosome problems and > 98% of open neural tube defects can be detected. This procedure is generally performed after the 15th week of pregnancy.  The main risks to this procedure include complications leading to miscarriage in less than 1 in 200 cases (0.5%).  As another option for information if the pregnancy is suspected to be an an increased chance for certain chromosome conditions, we also reviewed the availability of cell free fetal DNA testing from maternal blood to determine whether or not the baby may have either Down syndrome, trisomy 1913, or trisomy 318.  This test utilizes a maternal blood sample and DNA sequencing technology to isolate circulating cell free fetal DNA from maternal plasma.  The fetal DNA can then be analyzed for DNA sequences that are derived from the three most common chromosomes involved in aneuploidy, chromosomes 13, 18, and 21.  If the overall amount of DNA is greater than the expected level for any of these chromosomes, aneuploidy is suspected.  While we do not consider it a replacement for invasive testing and karyotype analysis, a negative result from this testing would be reassuring, though not a guarantee of a normal chromosome complement for the baby.  An abnormal result is certainly suggestive of an abnormal chromosome complement, though we would still recommend CVS or amniocentesis to confirm any findings  from this testing.  Cystic Fibrosis screening was also discussed with the patient. Cystic fibrosis (CF) is one of the most common genetic conditions in persons of Caucasian ancestry.  This condition occurs in approximately 1 in 2,500 Caucasian persons and  results in thickened secretions in the lungs, digestive, and reproductive systems.  For a baby to be at risk for having CF, both of the parents must be carriers for this condition.  Approximately 1 in 56 Caucasian persons is a carrier for CF.  Current carrier testing looks for the most common mutations in the gene for CF and can detect approximately 90% of carriers in the Caucasian population.  This means that the carrier screening can greatly reduce, but cannot eliminate, the chance for an individual to have a child with CF.  If an individual is found to be a carrier for CF, then carrier testing would be available for the partner. As part of [place]'s newborn screening profile, all babies born in the state of [place] will have a two-tier screening process.  Specimens are first tested to determine the concentration of immunoreactive trypsinogen (IRT).  The top 5% of specimens with the highest IRT values then undergo DNA testing using a panel of over 40 common CF mutations.   We obtained a detailed family history and pregnancy history.  This is the first pregnancy for Ms. Junio and her partner.  She has two previous children, ages 3 and 3 years.  The patient reported that her son (the 64 year old) was small for gestational age due to a detached placenta and abnormal cord; he was 4 pounds at term delivery via csection.  Dr. Fayrene Fearing spoke with the patient about this history and offered the option of monthly ultrasounds for growth in the third trimester, though there was no clear documentation of growth restriction in the delivery note. The remainder of the family history was reported to be unremarkable for birth defects, mental retardation, recurrent pregnancy loss or known chromosome abnormalities.  There was report of multiple family members with mental health diagnoses and several with substance abuse concerns including Dillon's mother and father, Madelin Rear, Ms. Engert' mother and grandfather as well as  herself.  She stated that she was diagnosed with bipolar and panic attacks several years ago but has not taken medications since soon after her diagnosis and she feels like she is doing well without any treatment.  We reviewed that while there is currently no known genetic test to determine who may develop mental health conditions, there are clearly inherited components in some families.  It is also no uncommon for persons with mental health conditions to self treat with substances other than those which are prescribed.  We therefore suggested that Madelin Rear could consider counseling services to assess for mental health conditions and that it would be important for the patient to be aware of this history for their children as they grow.  Ms. Carie reported no complications in the pregnancy.  She has had no medication, alcohol or recreational drug use.  She does smoke cigarettes, approximately 1/2 pack per day.  As we discussed, smoking during pregnancy has been associated with low birth weight, premature delivery and pregnancy loss.  For this reason, we suggest that she cut back or avoid smoking for the remainder of the pregnancy.  The father of the pregnancy is reported to have a history of recreational drug abuse, though he is currently clean.  He was using marijuana, meth, heroine, cocaine and crack  prior to and early into the pregnancy.  We reviewed that paternal use of medications and recreational drugs has not been shown to increase the chance for specific birth defects, though it may affected fertility.  It is thought the sperm which are damaged from the drug use are likely to be abnormal and unlikely to swim to the egg to be able to fertilize it.  Therefore, there is expected to be natural selection of normal sperm.  It is important that pregnant women not use these substances in pregnancy, as there may be increased risks to the fetus related to their use.  After consideration of the options, Ms. Seidenberg  elected to proceed with first trimester screening and to decline CF carrier testing.  An ultrasound was performed at the time of the visit.  The gestational age was consistent with 14 weeks.  Fetal anatomy could not be assessed due to early gestational age.  Please refer to the ultrasound report for details of that study.  Ms. Egli was encouraged to call with questions or concerns.  We can be contacted at [phone].  I saw the patient and agree with the assessment and recommendations.  Argentina Ponder, MD [facility]

## 2015-08-26 ENCOUNTER — Telehealth: Payer: Self-pay | Admitting: Obstetrics and Gynecology

## 2015-08-26 NOTE — Telephone Encounter (Signed)
  Jordan [name] elected to undergo First Trimester screening on 08/23/2015.  To review, first trimester screening, includes nuchal translucency ultrasound screen and/or first trimester maternal serum marker screening.  The nuchal translucency has approximately an 80% detection rate for Down syndrome and can be positive for other chromosome abnormalities as well as heart defects.  When combined with a maternal serum marker screening, the detection rate is up to 90% for Down syndrome and up to 97% for trisomy 13 and 18.     The results of the First Trimester Nuchal Translucency and Biochemical Screening were within normal range.  The risk for Down syndrome is now estimated to be less than 1 in 10,000.  The risk for Trisomy 13/18 is also estimated to be less than 1 in 10,000.  Should more definitive information be desired, we would offer amniocentesis.  Because we do not yet know the effectiveness of combined first and second trimester screening, we do not recommend a maternal serum screen to assess the chance for chromosome conditions.  However, if screening for neural tube defects is desired, maternal serum screening for AFP only can be performed between 15 and [redacted] weeks gestation.

## 2015-09-13 ENCOUNTER — Encounter: Payer: Self-pay | Admitting: Emergency Medicine

## 2015-09-13 ENCOUNTER — Emergency Department: Payer: Medicaid Other

## 2015-09-13 ENCOUNTER — Emergency Department
Admission: EM | Admit: 2015-09-13 | Discharge: 2015-09-13 | Disposition: A | Discharge status: Home / Self Care | Payer: Medicaid Other | Attending: Emergency Medicine | Admitting: Emergency Medicine

## 2015-09-13 DIAGNOSIS — O99332 Smoking (tobacco) complicating pregnancy, second trimester: Secondary | ICD-10-CM | POA: Diagnosis not present

## 2015-09-13 DIAGNOSIS — O4402 Placenta previa specified as without hemorrhage, second trimester: Secondary | ICD-10-CM

## 2015-09-13 DIAGNOSIS — O4412 Placenta previa with hemorrhage, second trimester: Secondary | ICD-10-CM | POA: Diagnosis not present

## 2015-09-13 DIAGNOSIS — Z3A18 18 weeks gestation of pregnancy: Secondary | ICD-10-CM | POA: Diagnosis not present

## 2015-09-13 DIAGNOSIS — N760 Acute vaginitis: Secondary | ICD-10-CM

## 2015-09-13 DIAGNOSIS — N939 Abnormal uterine and vaginal bleeding, unspecified: Secondary | ICD-10-CM

## 2015-09-13 DIAGNOSIS — Z79899 Other long term (current) drug therapy: Secondary | ICD-10-CM | POA: Diagnosis not present

## 2015-09-13 DIAGNOSIS — O23592 Infection of other part of genital tract in pregnancy, second trimester: Secondary | ICD-10-CM | POA: Diagnosis not present

## 2015-09-13 DIAGNOSIS — F17210 Nicotine dependence, cigarettes, uncomplicated: Secondary | ICD-10-CM | POA: Insufficient documentation

## 2015-09-13 DIAGNOSIS — Z880 Allergy status to penicillin: Secondary | ICD-10-CM | POA: Insufficient documentation

## 2015-09-13 DIAGNOSIS — O209 Hemorrhage in early pregnancy, unspecified: Secondary | ICD-10-CM | POA: Diagnosis present

## 2015-09-13 DIAGNOSIS — B9689 Other specified bacterial agents as the cause of diseases classified elsewhere: Secondary | ICD-10-CM

## 2015-09-13 LAB — URINALYSIS COMPLETE WITH MICROSCOPIC (ARMC ONLY)
BACTERIA UA: NONE SEEN
BILIRUBIN URINE: NEGATIVE
Glucose, UA: NEGATIVE mg/dL
Ketones, ur: NEGATIVE mg/dL
LEUKOCYTES UA: NEGATIVE
Nitrite: NEGATIVE
PH: 7 (ref 5.0–8.0)
PROTEIN: NEGATIVE mg/dL
Specific Gravity, Urine: 1.013 (ref 1.005–1.030)
WBC UA: NONE SEEN WBC/hpf (ref 0–5)

## 2015-09-13 LAB — COMPREHENSIVE METABOLIC PANEL
ALBUMIN: 3 g/dL — AB (ref 3.5–5.0)
ALT: 61 U/L — ABNORMAL HIGH (ref 14–54)
ANION GAP: 5 (ref 5–15)
AST: 77 U/L — AB (ref 15–41)
Alkaline Phosphatase: 181 U/L — ABNORMAL HIGH (ref 38–126)
BILIRUBIN TOTAL: 0.3 mg/dL (ref 0.3–1.2)
CHLORIDE: 105 mmol/L (ref 101–111)
CO2: 24 mmol/L (ref 22–32)
Calcium: 8.8 mg/dL — ABNORMAL LOW (ref 8.9–10.3)
Creatinine, Ser: 0.51 mg/dL (ref 0.44–1.00)
GFR calc Af Amer: >60 mL/min (ref >60)
GFR calc non Af Amer: >60 mL/min (ref >60)
GLUCOSE: 87 mg/dL (ref 65–99)
POTASSIUM: 3.9 mmol/L (ref 3.5–5.1)
SODIUM: 134 mmol/L — AB (ref 135–145)
TOTAL PROTEIN: 6.6 g/dL (ref 6.5–8.1)

## 2015-09-13 LAB — WET PREP, GENITAL
Trich, Wet Prep: NONE SEEN
Yeast Wet Prep HPF POC: NONE SEEN

## 2015-09-13 LAB — CHLAMYDIA/NGC RT PCR (ARMC ONLY)
Chlamydia Tr: NOT DETECTED
N GONORRHOEAE: NOT DETECTED

## 2015-09-13 LAB — CBC WITH DIFFERENTIAL/PLATELET
BASOS ABS: 0 10*3/uL (ref 0–0.1)
BASOS PCT: 0 %
EOS ABS: 0.2 10*3/uL (ref 0–0.7)
Eosinophils Relative: 2 %
HEMATOCRIT: 33.7 % — AB (ref 35.0–47.0)
HEMOGLOBIN: 11.1 g/dL — AB (ref 12.0–16.0)
Lymphocytes Relative: 32 %
Lymphs Abs: 3.3 10*3/uL (ref 1.0–3.6)
MCH: 27.6 pg (ref 26.0–34.0)
MCHC: 33 g/dL (ref 32.0–36.0)
MCV: 83.6 fL (ref 80.0–100.0)
MONO ABS: 0.9 10*3/uL (ref 0.2–0.9)
MONOS PCT: 9 %
NEUTROS ABS: 5.8 10*3/uL (ref 1.4–6.5)
NEUTROS PCT: 57 %
Platelets: 191 10*3/uL (ref 150–440)
RBC: 4.03 MIL/uL (ref 3.80–5.20)
RDW: 14 % (ref 11.5–14.5)
WBC: 10.3 10*3/uL (ref 3.6–11.0)

## 2015-09-13 LAB — TYPE AND SCREEN
ABO/RH(D): A POS
ANTIBODY SCREEN: NEGATIVE

## 2015-09-13 LAB — OB RESULTS CONSOLE GC/CHLAMYDIA
CHLAMYDIA, DNA PROBE: NEGATIVE
Gonorrhea: NEGATIVE

## 2015-09-13 LAB — ABO/RH: ABO/RH(D): A POS

## 2015-09-13 MED ORDER — METRONIDAZOLE 500 MG PO TABS: 500.0000 mg | ORAL_TABLET | Freq: Two times a day (BID) | ORAL | Status: AC

## 2015-09-13 NOTE — ED Provider Notes (Signed)
[facility] Emergency Department Provider Note  ____________________________________________  Time seen: Approximately 3:25 PM  I have reviewed the triage vital signs and the nursing notes.   HISTORY  Chief Complaint Vaginal Bleeding    HPI Jordan [name] is a 29 y.o. female G5 P to a 2 approximately [redacted] weeks pregnant presenting with vaginal bleeding and pelvic pain.Patient states that her pregnancy has been going as planned and that she has had regular prenatal care with a previous reportedly normal ultrasound. This morning she had sex around 11 AM and afterwards began to have some mild vaginal bleeding without clots, loss of tissue. She has intermittent mild sharp pains, no cramping area did no pain or burning when she urinates. No fever, nausea or vomiting.   Past Medical History  Diagnosis Date  . Interstitial cystitis   . Heart murmur 29 years old  . Interstitial cystitis 29 years old    Patient Active Problem List   Diagnosis Date Noted  . First trimester screening 08/23/2015    Past Surgical History  Procedure Laterality Date  . Cesarean section  2011/2013    Current Outpatient Rx  Name  Route  Sig  Dispense  Refill  . Prenatal Multivit-Min-Fe-FA (PRENATAL VITAMINS) 0.8 MG tablet   Oral   Take 1 tablet by mouth daily. Patient not taking: Reported on 08/23/2015   10 tablet   0   . promethazine (PHENERGAN) 25 MG tablet   Oral   Take 0.5 tablets (12.5 mg total) by mouth every 6 (six) hours as needed for nausea or vomiting. Patient not taking: Reported on 08/23/2015   10 tablet   0     Allergies Amoxicillin and Vicodin  No family history on file.  Social History Social History  Substance Use Topics  . Smoking status: Current Every Day Smoker -- 0.50 packs/day    Types: Cigarettes  . Smokeless tobacco: Never Used  . Alcohol Use: No    Review of Systems Constitutional: No fever/chills. No lightheadedness or shortness of  breath Eyes: No visual changes. ENT: No sore throat. Cardiovascular: Denies chest pain, palpitations. Respiratory: Denies shortness of breath.  No cough. Gastrointestinal: Positive lower abdominal pain.  No nausea, no vomiting.  No diarrhea.  No constipation. Genitourinary: Negative for dysuria. Positive vaginal bleeding. Negative vaginal discharge. No gush of fluid. Musculoskeletal: Negative for back pain. Skin: Negative for rash. Neurological: Negative for headaches, focal weakness or numbness.  10-point ROS otherwise negative.  ____________________________________________   PHYSICAL EXAM:  VITAL SIGNS: ED Triage Vitals  Enc Vitals Group     BP 09/13/15 1211 104/58 mmHg     Pulse Rate 09/13/15 1211 96     Resp 09/13/15 1211 16     Temp 09/13/15 1211 98.9 F (37.2 C)     Temp Source 09/13/15 1211 Oral     SpO2 09/13/15 1211 99 %     Weight 09/13/15 1211 104 lb (47.174 kg)     Height 09/13/15 1211 5\' 4"  (1.626 m)     Head Cir --      Peak Flow --      Pain Score 09/13/15 1213 3     Pain Loc --      Pain Edu? --      Excl. in GC? --     Constitutional: Alert and oriented. Well appearing and in no acute distress. Answer question appropriately. Eyes: Conjunctivae are normal.  EOMI. Head: Atraumatic. Nose: No congestion/rhinnorhea. Mouth/Throat: Mucous membranes are moist.  Neck: No stridor.  Supple.    Cardiovascular: Normal rate, regular rhythm. No murmurs, rubs or gallops.  Respiratory: Normal respiratory effort.  No retractions. Lungs CTAB.  No wheezes, rales or ronchi. Gastrointestinal: Soft and nondistended. Gravid uterus to 4 cm above the pubic symphysis. Minimal discomfort over the suprapubic and left lower pelvic region. GENITOURINARY: Normal-appearing external genitalia without lesions. Speculum exam reveals a mild amount of dark blood without vaginal discharge. Normal-appearing cervix. Bimanual exam reveals cervix which is closed, no CMT, no adnexal masses or  tenderness. Uterus is gravid to several centimeters above the pubic symphysis without pain. Musculoskeletal: No LE edema.  Neurologic:  Normal speech and language. No gross focal neurologic deficits are appreciated.  Skin:  Skin is warm, dry and intact. No rash noted. Psychiatric: Mood and affect are normal. Speech and behavior are normal.  Normal judgement.  ____________________________________________   LABS (all labs ordered are listed, but only abnormal results are displayed)  Labs Reviewed  WET PREP, GENITAL - Abnormal; Notable for the following:    Clue Cells Wet Prep HPF POC FEW (*)    WBC, Wet Prep HPF POC MODERATE (*)    All other components within normal limits  CBC WITH DIFFERENTIAL/PLATELET - Abnormal; Notable for the following:    Hemoglobin 11.1 (*)    HCT 33.7 (*)    All other components within normal limits  COMPREHENSIVE METABOLIC PANEL - Abnormal; Notable for the following:    Sodium 134 (*)    BUN <5 (*)    Calcium 8.8 (*)    Albumin 3.0 (*)    AST 77 (*)    ALT 61 (*)    Alkaline Phosphatase 181 (*)    All other components within normal limits  URINALYSIS COMPLETEWITH MICROSCOPIC ([facility] ONLY) - Abnormal; Notable for the following:    Color, Urine YELLOW (*)    APPearance CLEAR (*)    Hgb urine dipstick 2+ (*)    Squamous Epithelial / LPF 0-5 (*)    All other components within normal limits  CHLAMYDIA/NGC RT PCR ([facility] ONLY)  TYPE AND SCREEN  ABO/RH   ____________________________________________  EKG  Not indicated ____________________________________________  RADIOLOGY  US Ob Limited  09/13/2015  CLINICAL DATA:  Acute vaginal bleeding and pain EXAM: LIMITED OBSTETRIC ULTRASOUND FINDINGS: Number of Fetuses: 1 Heart Rate:  155 bpm Movement: Yes Presentation: Variable Placental Location: Anterior and toward the left Previa: Placenta covers the cervical os consistent with complete previa. There is no demonstrable placental abruption. Amniotic  Fluid (Subjective): Within normal limits for gestational age BPD:  3.9cm 17w  6d MATERNAL FINDINGS: Cervix:  Appears closed.  Cervix length is 4.0 cm Uterus/Adnexae:  No abnormality visualized. IMPRESSION: Single live intrauterine gestation with estimated gestational age of approximately 18 weeks. Evidence of complete placenta previa. No placental abruption. Cervical os is closed. Amniotic fluid volume normal for gestational age. No extrauterine maternal pelvic mass or fluid. Given complete placenta previa, close clinical and imaging surveillance will be warranted. This exam is performed as a limited study on an emergent basis to address a specific clinical concern and does not comprehensively evaluate fetal size, dating, or anatomy; follow-up complete OB US should be considered if further fetal assessment is warranted. Electronically Signed   By: Bretta Bang III M.D.   On: 09/13/2015 16:46    ____________________________________________   PROCEDURES  Procedure(s) performed: None  Critical Care performed: No ____________________________________________   INITIAL IMPRESSION / ASSESSMENT AND PLAN / ED COURSE  Pertinent  labs & imaging results that were available during my care of the patient were reviewed by me and considered in my medical decision making (see chart for details).  29 y.o. G5 P2 A2 approximately [redacted] weeks pregnant with mild vaginal discharge and lower abdominal pain after sexual intercourse. Patient does not have any symptoms that are consistent with severe anemia. She does have a reportedly normal ultrasound so ectopic is less likely but we will confirm the pregnancy with ultrasound here. Plan to evaluate for STI or other vaginal infection. Consider threatened AB.  ----------------------------------------- 5:47 PM on 09/13/2015 -----------------------------------------  On ultrasound, the patient does have an IUP with normal fetal heart rate and placenta previa. There is  no evidence for placental abruption. The patient is hemodynamically stable, having scant bleeding, and has a hematocrit of 33. I have discussed the patient's case with the midwife on call for the [facility] clinic who takes the call for the OB/GYN clinic, and she recommends discharge with pelvic rest and follow-up in 2 days. I have discussed this with the patient, as well as given her oral and written instructions about return precautions and follow-up. She and her significant other understand. ____________________________________________  FINAL CLINICAL IMPRESSION(S) / ED DIAGNOSES  Final diagnoses:  Bacterial vaginosis  Placenta previa, second trimester  Vaginal bleeding      NEW MEDICATIONS STARTED DURING THIS VISIT:  New Prescriptions   No medications on file     Rockne Menghini, MD 09/13/15 1749

## 2015-09-13 NOTE — Discharge Instructions (Signed)
Please drink plenty of fluid to stay well hydrated. Please follow the instructions for pelvic rest prevent further bleeding. Please make an appointment at [facility] for Wednesday.   Please return to the emergency department if you develop increased bleeding, bleeding as much as a period, lightheadedness, shortness of breath, fainting, abdominal pain, fever or any other symptoms concerning to you.

## 2015-09-13 NOTE — ED Notes (Signed)
Pt in today w/ complaints of vaginal bleeding after intercourse today; pt states she approximately [redacted] weeks pregnant.  Pt also reports periodic stabbing pain throughout lower back and pelvic area.  Pt A/O x4, no signs of immediate distress at this time.

## 2015-09-13 NOTE — ED Notes (Addendum)
[redacted] weeks pregnant started vaginal bleeding about 30 minutes PTA.  C/o lower abdominal "pressure" and low back pain.  Denies dysuria.  EDC:  02/19/2016.  G: 6  P: 2  A:3

## 2015-09-13 NOTE — Addendum Note (Signed)
Encounter addended by: Lady DeutscherAndra Raenah Murley, MD on: 09/13/2015  8:49 AM<BR>     Documentation filed: Charges VN, Notes Section

## 2015-09-28 ENCOUNTER — Encounter: Payer: Self-pay | Admitting: Emergency Medicine

## 2015-09-28 ENCOUNTER — Emergency Department
Admission: EM | Admit: 2015-09-28 | Discharge: 2015-09-28 | Disposition: A | Discharge status: Home / Self Care | Payer: No Typology Code available for payment source | Attending: Emergency Medicine | Admitting: Emergency Medicine

## 2015-09-28 ENCOUNTER — Emergency Department: Payer: No Typology Code available for payment source

## 2015-09-28 DIAGNOSIS — S3991XA Unspecified injury of abdomen, initial encounter: Secondary | ICD-10-CM | POA: Insufficient documentation

## 2015-09-28 DIAGNOSIS — Y998 Other external cause status: Secondary | ICD-10-CM | POA: Diagnosis not present

## 2015-09-28 DIAGNOSIS — S199XXA Unspecified injury of neck, initial encounter: Secondary | ICD-10-CM | POA: Insufficient documentation

## 2015-09-28 DIAGNOSIS — O9A212 Injury, poisoning and certain other consequences of external causes complicating pregnancy, second trimester: Secondary | ICD-10-CM | POA: Insufficient documentation

## 2015-09-28 DIAGNOSIS — F17210 Nicotine dependence, cigarettes, uncomplicated: Secondary | ICD-10-CM | POA: Diagnosis not present

## 2015-09-28 DIAGNOSIS — O209 Hemorrhage in early pregnancy, unspecified: Secondary | ICD-10-CM | POA: Diagnosis not present

## 2015-09-28 DIAGNOSIS — Z3A19 19 weeks gestation of pregnancy: Secondary | ICD-10-CM | POA: Diagnosis not present

## 2015-09-28 DIAGNOSIS — Z880 Allergy status to penicillin: Secondary | ICD-10-CM | POA: Diagnosis not present

## 2015-09-28 DIAGNOSIS — R109 Unspecified abdominal pain: Secondary | ICD-10-CM

## 2015-09-28 DIAGNOSIS — Y9389 Activity, other specified: Secondary | ICD-10-CM | POA: Diagnosis not present

## 2015-09-28 DIAGNOSIS — Y92410 Unspecified street and highway as the place of occurrence of the external cause: Secondary | ICD-10-CM | POA: Diagnosis not present

## 2015-09-28 DIAGNOSIS — O99332 Smoking (tobacco) complicating pregnancy, second trimester: Secondary | ICD-10-CM | POA: Insufficient documentation

## 2015-09-28 DIAGNOSIS — O4692 Antepartum hemorrhage, unspecified, second trimester: Secondary | ICD-10-CM

## 2015-09-28 DIAGNOSIS — S3992XA Unspecified injury of lower back, initial encounter: Secondary | ICD-10-CM | POA: Diagnosis not present

## 2015-09-28 HISTORY — DX: Complete placenta previa nos or without hemorrhage, unspecified trimester: O44.00
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
## 2015-09-28 NOTE — ED Notes (Signed)
Patient transported to Ultrasound 
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
## 2015-09-28 NOTE — ED Notes (Signed)
Pt also states placenta previa this pregnancy.

## 2015-10-12 LAB — OB RESULTS CONSOLE HEPATITIS B SURFACE ANTIGEN: HEP B S AG: NEGATIVE

## 2015-10-12 LAB — OB RESULTS CONSOLE RPR: RPR: NONREACTIVE

## 2015-10-12 LAB — OB RESULTS CONSOLE VARICELLA ZOSTER ANTIBODY, IGG: VARICELLA IGG: IMMUNE

## 2015-10-12 LAB — OB RESULTS CONSOLE HIV ANTIBODY (ROUTINE TESTING): HIV: NONREACTIVE

## 2015-10-25 ENCOUNTER — Ambulatory Visit: Payer: Medicaid Other | Admitting: Neurology

## 2016-01-21 NOTE — H&P (Signed)
Jordan [name] is a 30 y.o. female G5P2 withEDC of 02/19/16 based on 14+2 week u/s  presenting for elective repeat LTCS and elective BTL on 02/14/16  1. Previous C/S x 2 - cord prolapse, then elective - planning repeat C/S - OP notes in chart  []  NEEDS TO BE SCHEDULED FOR 39wk 3'LTCS  []  wants BTL, consent signed 01/14/16  Tobacco Use in Pregnancy smoking 1 ppd  Migraines - 2 x per week - Refer to Neurology / discussed use of Tylenol 1000mg  and Reglan 10mg  prescribed per Up To Date migraine protocol-[place] [facility] appt 09/21/15  Hx. Of Heart Murmur and frequent heart palpitations - Refer to Cardiology-Patient scheduled 11/18/15 (ECHO-WNL)  2. Hx. Of Growth Restriction with last baby:   []  MFM recommends growth US every 4-6 weeks starting at [redacted] weeks gestation   Pap LGSIL cannot exclude HGSIL, Colpo scheduled (09/09/15)   Placenta previa - resolved on 10/07/15  Placenta accreta-Per Dr Rennis ChrisEllested, no follow up needed  Screening results and needs:  NOB  MBT  A+  Ab screen  Neg   Pap LGSIL cannot exclude HGSIL, Colpo set up  HIV Neg Hep B/RPR  Neg/NR  Rubella Immune   VZV Immune  Aneuploidy:   First trimester (Informaseq, NT): NT normal   Second trimester (AFP/tetra): negative  28 weeks:   Blood consent: 11/16/15  Hgb:10.1   Glucola:93  Rhogam: N/A  36 weeks  GBS:   G/C:   Hgb:   Last US:  09/13/15 in ER. 8259w6d, ant placenta, complete placenta previa, no placental abruption, cervical length 4.0 cm   10/07/15- Single gestation/ hr 141 bpm/Ant plac No previa seen/breech/Normal anatomy seen.  11/24/15- breech, ant plac, AFI- 226mm @95 %, EFW- 1131g @55 %  Immunization:    Flu in season - given 09/09/15  Tdap at 27-36 weeks - given 11/26/15  Social: no  changes  Contraception Plan:signed Medicaid BTL 01/14/16  Feeding Plan:    . History OB History    Gravida Para Term Preterm AB TAB SAB Ectopic Multiple Living   5 2 2  2     2      Past Medical History  Diagnosis  Date  . Interstitial cystitis   . Heart murmur 30 years old  . Interstitial cystitis 30 years old  . Placenta previa    Past Surgical History  Procedure Laterality Date  . Cesarean section  2011/2013   Family History: family history is not on file. Social History:  reports that she has been smoking Cigarettes.  She has been smoking about 0.50 packs per day. She has never used smokeless tobacco. She reports that she does not drink alcohol or use illicit drugs.   Prenatal Transfer Tool  Maternal Diabetes: No Genetic Screening: Normal Maternal Ultrasounds/Referrals: Normaliniital previa , which resolved  Fetal Ultrasounds or other Referrals:  Other: no evidence of previa or accreta  Maternal Substance Abuse:  No Significant Maternal Medications:  None Significant Maternal Lab Results:  None Other Comments:  tobacco use  ROS    Last menstrual period 05/25/2015. Exam Physical Exam  Prenatal labs: ABO, Rh: --/--/A POS (11/07 1533) Antibody: NEG (11/07 1532) Rubella:  imm RPR:   nr HBsAg:   neg HIV:   neg  GBS:   not done yet   Assessment/Plan: Elective repeat c/s + BTL  On 02/14/16. Risks of the procedure d/w the pt . All questions answered. Failure rate of 1:200 discussed    Jordan [name] 01/21/2016, 11:55 AM

## 2016-02-03 ENCOUNTER — Inpatient Hospital Stay
Admission: EM | Admit: 2016-02-03 | Discharge: 2016-02-03 | Disposition: A | Discharge status: Home / Self Care | Payer: No Typology Code available for payment source | Attending: Obstetrics and Gynecology | Admitting: Obstetrics and Gynecology

## 2016-02-03 DIAGNOSIS — O34219 Maternal care for unspecified type scar from previous cesarean delivery: Secondary | ICD-10-CM | POA: Insufficient documentation

## 2016-02-03 DIAGNOSIS — Z3A37 37 weeks gestation of pregnancy: Secondary | ICD-10-CM | POA: Diagnosis not present

## 2016-02-03 LAB — URINALYSIS COMPLETE WITH MICROSCOPIC (ARMC ONLY)
Bilirubin Urine: NEGATIVE
Glucose, UA: NEGATIVE mg/dL
Hgb urine dipstick: NEGATIVE
Ketones, ur: NEGATIVE mg/dL
Leukocytes, UA: NEGATIVE
Nitrite: NEGATIVE
PROTEIN: NEGATIVE mg/dL
Specific Gravity, Urine: 1.005 (ref 1.005–1.030)
pH: 7 (ref 5.0–8.0)

## 2016-02-03 LAB — COMPREHENSIVE METABOLIC PANEL
ALBUMIN: 2.8 g/dL — AB (ref 3.5–5.0)
ALT: 11 U/L — AB (ref 14–54)
AST: 21 U/L (ref 15–41)
Alkaline Phosphatase: 157 U/L — ABNORMAL HIGH (ref 38–126)
Anion gap: 7 (ref 5–15)
BILIRUBIN TOTAL: 0.4 mg/dL (ref 0.3–1.2)
BUN: 7 mg/dL (ref 6–20)
CO2: 21 mmol/L — ABNORMAL LOW (ref 22–32)
Calcium: 8.5 mg/dL — ABNORMAL LOW (ref 8.9–10.3)
Chloride: 104 mmol/L (ref 101–111)
Creatinine, Ser: 0.55 mg/dL (ref 0.44–1.00)
GFR calc Af Amer: >60 mL/min (ref >60)
GFR calc non Af Amer: >60 mL/min (ref >60)
GLUCOSE: 77 mg/dL (ref 65–99)
POTASSIUM: 3.7 mmol/L (ref 3.5–5.1)
Sodium: 132 mmol/L — ABNORMAL LOW (ref 135–145)
TOTAL PROTEIN: 6.7 g/dL (ref 6.5–8.1)

## 2016-02-03 LAB — CBC
HEMATOCRIT: 34 % — AB (ref 35.0–47.0)
HEMOGLOBIN: 11.3 g/dL — AB (ref 12.0–16.0)
MCH: 26.9 pg (ref 26.0–34.0)
MCHC: 33.1 g/dL (ref 32.0–36.0)
MCV: 81.3 fL (ref 80.0–100.0)
Platelets: 290 10*3/uL (ref 150–440)
RBC: 4.18 MIL/uL (ref 3.80–5.20)
RDW: 13.5 % (ref 11.5–14.5)
WBC: 13.4 10*3/uL — AB (ref 3.6–11.0)

## 2016-02-03 LAB — TYPE AND SCREEN
ABO/RH(D): A POS
Antibody Screen: NEGATIVE

## 2016-02-03 MED ORDER — TERBUTALINE SULFATE 1 MG/ML IJ SOLN
0.2500 mg | Freq: Once | INTRAMUSCULAR | Status: AC
  Administered 2016-02-03: 0.25 mg via SUBCUTANEOUS

## 2016-02-03 MED ORDER — LACTATED RINGERS IV BOLUS (SEPSIS)
1000.0000 mL | Freq: Once | INTRAVENOUS | Status: AC
  Administered 2016-02-03: 1000 mL via INTRAVENOUS

## 2016-02-03 MED ORDER — TERBUTALINE SULFATE 1 MG/ML IJ SOLN
INTRAMUSCULAR | Status: AC
  Administered 2016-02-03: 0.25 mg via SUBCUTANEOUS
  Filled 2016-02-03: qty 1

## 2016-02-03 MED ORDER — LACTATED RINGERS IV SOLN
INTRAVENOUS | Status: DC
  Administered 2016-02-03 (×2): via INTRAVENOUS

## 2016-02-03 MED ORDER — ACETAMINOPHEN 500 MG PO TABS
1000.0000 mg | ORAL_TABLET | Freq: Once | ORAL | Status: AC
  Administered 2016-02-03: 1000 mg via ORAL
  Filled 2016-02-03: qty 2

## 2016-02-03 NOTE — H&P (Addendum)
MD NOTE:  LMP: 05/25/15 EDD: 02/19/16 based on 14+2 week u/s    Jordan [name] is a 30 y.o. female G5P2 @ 37+5 with h/o 2 previous c/s and presenting with left flank pain. Intermittent, since 10:30am. Mild pain. No dyspnea, no chest pain, no heart palpitations, no dysuria, no fever/chills/cough. +FM. No LOF.   She was found to be contracting every 1-3 minutes on admission.   APC: [facility]  1. Previous C/S x 2 - cord prolapse, then elective - planning repeat C/S - OP notes in chart  BTL, consent signed 01/14/16  Tobacco Use in Pregnancy smoking 1 ppd  Migraines - 2 x per week - Refer to Neurology / discussed use of Tylenol 1000mg  and Reglan 10mg  prescribed per Up To Date migraine protocol-[place] [facility] appt 09/21/15  Hx. Of Heart Murmur and frequent heart palpitations - Refer to Cardiology-Patient scheduled 11/18/15 (ECHO-WNL)  2. Hx. Of Growth Restriction with last baby:   []  MFM recommends growth US every 4-6 weeks starting at [redacted] weeks gestation   Pap LGSIL cannot exclude HGSIL, Colpo scheduled (09/09/15)   Placenta previa - resolved on 10/07/15  Initial concern for placenta accreta based on initial scans -Cleared by MFM for limited risk of accreta based on their review of scans    Screening results and needs:  NOB  MBT A+ Ab screen Neg Pap LGSIL cannot exclude HGSIL, Colpo set up HIV Neg Hep B/RPR Neg/NR  Rubella Immune VZV Immune  Aneuploidy:   First trimester (Informaseq, NT): NT normal Second trimester (AFP/tetra): negative  28 weeks:   Blood consent: 11/16/15  Hgb:10.1 Glucola:93 Rhogam: N/A  36 weeks  GBS: G/C: Hgb:   Last US: 09/13/15 in ER. 3629w6d, ant placenta, complete placenta previa, no placental abruption, cervical length 4.0 cm  10/07/15- Single gestation/ hr 141 bpm/Ant plac No previa seen/breech/Normal anatomy seen.  11/24/15- breech, ant plac, AFI- 226mm @95 %, EFW- 1131g @55 %  Immunization:   Flu in  season - given 09/09/15  Tdap at 27-36 weeks - given 11/26/15  Social: no changes  Contraception Plan:signed Medicaid BTL 01/14/16  Feeding Plan:    . History OB History    Gravida Para Term Preterm AB TAB SAB Ectopic Multiple Living   5 2 2  2     2      Past Medical History  Diagnosis Date  . Interstitial cystitis   . Heart murmur 30 years old  . Interstitial cystitis 30 years old  . Placenta previa    Past Surgical History  Procedure Laterality Date  . Cesarean section  2011/2013   Family History: family history is not on file. Social History:  reports that she has been smoking Cigarettes. She has been smoking about 0.50 packs per day. She has never used smokeless tobacco. She reports that she does not drink alcohol or use illicit drugs.   Prenatal Transfer Tool  Maternal Diabetes: No Genetic Screening: Normal Maternal Ultrasounds/Referrals: Normaliniital previa , which resolved  Fetal Ultrasounds or other Referrals: Other: no evidence of previa or accreta  Maternal Substance Abuse: No Significant Maternal Medications: None Significant Maternal Lab Results: None Other Comments: tobacco use  ROS    Last menstrual period 05/25/2015. Exam Physical Exam  Prenatal labs: ABO, Rh: --/--/A POS (11/07 1533) Antibody: NEG (11/07 1532) Rubella:  imm RPR:   nr HBsAg:   neg HIV:   neg  GBS:   not done yet   O: Filed Vitals:   02/03/16 0104  BP: 107/65  Pulse: 76  Temp: 97.5 F (36.4 C)  TempSrc: Oral  Resp: 18  Height:  (1.626 m)  Weight: 63.504 kg (140 lb)   General appearance: alert, cooperative and no distress Back: symmetric, no curvature. ROM normal. No CVA tenderness. some left costal tenderness, no point tenderness Abdomen: soft, non-tender; bowel sounds normal; no masses,  no organomegaly, gravid, vtx by leopolds, EFW 3000g Extremities: extremities normal, atraumatic, no cyanosis or  edema Skin: Skin color, texture, turgor normal. No rashes or lesions   SVE: per RN 1.5/long/post --> 2.5 long/post over 2 hours, no blood on glove  EFM: 130 mod var, +accels, 2 variable decels to 90 lasting <1 minute over 4 hours of tracing with spont recovery Toco: Initially Q1-3 min --> 0.27mcg of terb @ 4;12am --> Q3-7min, not felt by patient  CBC Latest Ref Rng 02/03/2016 09/13/2015 07/22/2015  WBC 3.6 - 11.0 K/uL 13.4(H) 10.3 11.5(H)  Hemoglobin 12.0 - 16.0 g/dL 11.3(L) 11.1(L) 11.6(L)  Hematocrit 35.0 - 47.0 % 34.0(L) 33.7(L) 35.3  Platelets 150 - 440 K/uL 290 191 227    CMP Latest Ref Rng 02/03/2016 09/13/2015 07/22/2015  Glucose 65 - 99 mg/dL 77 87 92  BUN 6 - 20 mg/dL 7 <1(L) 8  Creatinine 2.44 - 1.00 mg/dL 0.10 2.72 5.36  Sodium 135 - 145 mmol/L 132(L) 134(L) 135  Potassium 3.5 - 5.1 mmol/L 3.7 3.9 3.5  Chloride 101 - 111 mmol/L 104 105 105  CO2 22 - 32 mmol/L 21(L) 24 21(L)  Calcium 8.9 - 10.3 mg/dL 6.4(Q) 0.3(K) 9.3  Total Protein 6.5 - 8.1 g/dL 6.7 6.6 7.4  Total Bilirubin 0.3 - 1.2 mg/dL 0.4 0.3 0.4  Alkaline Phos 38 - 126 U/L 157(H) 181(H) 50  AST 15 - 41 U/L 21 77(H) 21  ALT 14 - 54 U/L 11(L) 61(H) 17   Urinalysis    Component Value Date/Time   COLORURINE STRAW* 02/03/2016 0141   COLORURINE Amber 08/08/2014 0033   APPEARANCEUR CLEAR* 02/03/2016 0141   APPEARANCEUR Clear 08/08/2014 0033   LABSPEC 1.005 02/03/2016 0141   LABSPEC 1.019 08/08/2014 0033   PHURINE 7.0 02/03/2016 0141   PHURINE 6.0 08/08/2014 0033   GLUCOSEU NEGATIVE 02/03/2016 0141   GLUCOSEU Negative 08/08/2014 0033   HGBUR NEGATIVE 02/03/2016 0141   HGBUR 1+ 08/08/2014 0033   BILIRUBINUR NEGATIVE 02/03/2016 0141   BILIRUBINUR Negative 08/08/2014 0033   KETONESUR NEGATIVE 02/03/2016 0141   KETONESUR Negative 08/08/2014 0033   PROTEINUR NEGATIVE 02/03/2016 0141   PROTEINUR Negative 08/08/2014 0033   NITRITE NEGATIVE 02/03/2016 0141   NITRITE Positive 08/08/2014 0033   LEUKOCYTESUR NEGATIVE  02/03/2016 0141   LEUKOCYTESUR Negative 08/08/2014 0033      Assessment/Plan: 30 y.o. female G5P2 @ 37+5 with h/o 2 previous c/s with contractions, possible early labor given small cervical change, but contractions not felt by patient. Fetal tracing reassuring. Will continue to monitor now s/p terb at 4:12am and IVH. If continued cervical change, will proceed with 3'C/S and BTL  1. Previous c/s contracting - T&S sent - NPO - IVH bolus x 2 liters over 4 hours - s/p terbutaline  Reassess at 6am  Ala Dach, MD      Addendum:  Same cervical exam at 6am. Cont observation for now. Handoff given to Dr. Dalbert Garnet.  Cat 1 tracing, Ctx q4-74min   Ala Dach, MD

## 2016-02-03 NOTE — Progress Notes (Signed)
IV removed (no IV start documented), tip intact

## 2016-02-03 NOTE — OB Triage Note (Signed)
Pt arrive to unit with complaint of intermittent left flank pain beginning around 10am on 02/02/16. Denies painful urination, bleeding, leaking fluid. + FM.

## 2016-02-03 NOTE — Discharge Summary (Signed)
TRIAGE VISIT with NST   Rogelia Miremanda D Riden is a 30 y.o. [id number]. She is at 318w5d gestation, presenting with signs of labor.  Indication: Contractions  S: Resting comfortably. Irregular CTX, no VB. Active fetal movement.  O:  BP 113/53 mmHg  Pulse 93  Temp(Src) 98 F (36.7 C) (Oral)  Resp 18  Ht 5\' 4"  (1.626 m)  Wt 63.504 kg (140 lb)  BMI 24.02 kg/m2  LMP 05/25/2015 Results for orders placed or performed during the hospital encounter of 02/03/16 (from the past 48 hour(s))  Urinalysis complete, with microscopic [facility] only)   Collection Time: 02/03/16  1:41 AM  Result Value Ref Range   Color, Urine STRAW (A) YELLOW   APPearance CLEAR (A) CLEAR   Glucose, UA NEGATIVE NEGATIVE mg/dL   Bilirubin Urine NEGATIVE NEGATIVE   Ketones, ur NEGATIVE NEGATIVE mg/dL   Specific Gravity, Urine 1.005 1.005 - 1.030   Hgb urine dipstick NEGATIVE NEGATIVE   pH 7.0 5.0 - 8.0   Protein, ur NEGATIVE NEGATIVE mg/dL   Nitrite NEGATIVE NEGATIVE   Leukocytes, UA NEGATIVE NEGATIVE   RBC / HPF 0-5 0 - 5 RBC/hpf   WBC, UA 0-5 0 - 5 WBC/hpf   Bacteria, UA RARE (A) NONE SEEN   Squamous Epithelial / LPF 0-5 (A) NONE SEEN  CBC   Collection Time: 02/03/16  1:42 AM  Result Value Ref Range   WBC 13.4 (H) 3.6 - 11.0 K/uL   RBC 4.18 3.80 - 5.20 MIL/uL   Hemoglobin 11.3 (L) 12.0 - 16.0 g/dL   HCT 45.434.0 (L) 09.835.0 - 11.947.0 %   MCV 81.3 80.0 - 100.0 fL   MCH 26.9 26.0 - 34.0 pg   MCHC 33.1 32.0 - 36.0 g/dL   RDW 14.713.5 82.911.5 - 56.214.5 %   Platelets 290 150 - 440 K/uL  Comprehensive metabolic panel   Collection Time: 02/03/16  1:42 AM  Result Value Ref Range   Sodium 132 (L) 135 - 145 mmol/L   Potassium 3.7 3.5 - 5.1 mmol/L   Chloride 104 101 - 111 mmol/L   CO2 21 (L) 22 - 32 mmol/L   Glucose, Bld 77 65 - 99 mg/dL   BUN 7 6 - 20 mg/dL   Creatinine, Ser 1.300.55 0.44 - 1.00 mg/dL   Calcium 8.5 (L) 8.9 - 10.3 mg/dL   Total Protein 6.7 6.5 - 8.1 g/dL   Albumin 2.8 (L) 3.5 - 5.0 g/dL   AST 21 15 - 41 U/L   ALT 11 (L)  14 - 54 U/L   Alkaline Phosphatase 157 (H) 38 - 126 U/L   Total Bilirubin 0.4 0.3 - 1.2 mg/dL   GFR calc non Af Amer >60 >60 mL/min   GFR calc Af Amer >60 >60 mL/min   Anion gap 7 5 - 15  Type and screen [facility]   Collection Time: 02/03/16  1:42 AM  Result Value Ref Range   ABO/RH(D) A POS    Antibody Screen NEG    Sample Expiration 02/06/2016      Gen: NAD, AAOx3      Abd: FNTTP      Ext: Non-tender, Nonedmeatous    FHT: 135, mod var, +accels, no decels TOCO: quiet QMV:HQIONGEXSVE:Dilation: 2.5 Effacement (%): 50 Cervical Position: Posterior Station: Ballotable Presentation: Undeterminable Exam by:: RS  NST: Reactive. See FHT above for particulars.  A/P:  30 y.o. [id number] 7918w5d with prior C/S x1, admitted with contractions.   Labor: not present. Serial SVE unchanged  cervix. Contractions spaced irregular q3-10 min apart, pt not feeling them.      Fetal Wellbeing: Reassuring Cat 1 tracing.  D/c home stable, precautions reviewed including return with palpable contractions, SROM, decreased fetal movement., follow-up as scheduled.

## 2016-02-05 LAB — URINE CULTURE

## 2016-02-11 ENCOUNTER — Other Ambulatory Visit: Payer: Medicaid Other

## 2016-02-11 ENCOUNTER — Encounter
Admission: RE | Admit: 2016-02-11 | Discharge: 2016-02-11 | Disposition: A | Discharge status: Home / Self Care | Payer: Medicaid Other | Source: Ambulatory Visit | Attending: Obstetrics and Gynecology | Admitting: Obstetrics and Gynecology

## 2016-02-11 DIAGNOSIS — Z01812 Encounter for preprocedural laboratory examination: Secondary | ICD-10-CM | POA: Diagnosis not present

## 2016-02-11 HISTORY — DX: Anxiety disorder, unspecified: F41.9

## 2016-02-11 HISTORY — DX: Headache: R51

## 2016-02-11 HISTORY — DX: Restless legs syndrome: G25.81

## 2016-02-11 HISTORY — DX: Anemia, unspecified: D64.9

## 2016-02-11 HISTORY — DX: Headache, unspecified: R51.9

## 2016-02-11 HISTORY — DX: Unspecified asthma, uncomplicated: J45.909

## 2016-02-11 LAB — TYPE AND SCREEN
ABO/RH(D): A POS
ANTIBODY SCREEN: NEGATIVE
EXTEND SAMPLE REASON: UNDETERMINED

## 2016-02-11 LAB — CBC
HCT: 37.1 % (ref 35.0–47.0)
Hemoglobin: 12.2 g/dL (ref 12.0–16.0)
MCH: 26.6 pg (ref 26.0–34.0)
MCHC: 32.8 g/dL (ref 32.0–36.0)
MCV: 81.1 fL (ref 80.0–100.0)
PLATELETS: 305 10*3/uL (ref 150–440)
RBC: 4.58 MIL/uL (ref 3.80–5.20)
RDW: 14.2 % (ref 11.5–14.5)
WBC: 12.6 10*3/uL — AB (ref 3.6–11.0)

## 2016-02-11 NOTE — Patient Instructions (Signed)
  Your procedure is scheduled on: February 14, 2016 (Monday) Report to Emergency Department  . To find out your arrival time please call [phone] between 1PM - 3PM on ARRIVAL TIME  5:30 AM.  Remember: Instructions that are not followed completely may result in serious medical risk, up to and including death, or upon the discretion of your surgeon and anesthesiologist your surgery may need to be rescheduled.    __x__ 1. Do not eat food or drink liquids after midnight. No gum chewing or hard candies.     ____ 2. No Alcohol for 24 hours before or after surgery.   ____ 3. Bring all medications with you on the day of surgery if instructed.    __x__ 4. Notify your doctor if there is any change in your medical condition     (cold, fever, infections).     Do not wear jewelry, make-up, hairpins, clips or nail polish.  Do not wear lotions, powders, or perfumes. You may wear deodorant.  Do not shave 48 hours prior to surgery. Men may shave face and neck.  Do not bring valuables to the hospital.    [facility] not [facility] any belongings or valuables.               Contacts, dentures or bridgework may not be worn into surgery.  Leave your suitcase in the car. After surgery it may be brought to your room.  For patients admitted to the hospital, discharge time is determined by your                treatment team.   Patients discharged the day of surgery will not be allowed to drive home.   Please read over the following fact sheets that you were given:   Surgical Site Infection Prevention   ____ Take these medicines the morning of surgery with A SIP OF WATER:    1.   2.   3.   4.  5.  6.  ____ Fleet Enema (as directed)   _x___ Use CHG Soap as directed (SAGE  WIPES)  ____ Use inhalers on the day of surgery  ____ Stop metformin 2 days prior to surgery    ____ Take 1/2 of usual insulin dose the night before surgery and none on the morning of surgery.   _x___ Stop  Coumadin/Plavix/aspirin on (N/A)  _x___ Stop Anti-inflammatories on (NO NSAIDS)   ____ Stop supplements until after surgery.    ____ Bring C-Pap to the hospital.

## 2016-02-12 LAB — RPR: RPR Ser Ql: NONREACTIVE

## 2016-02-14 ENCOUNTER — Inpatient Hospital Stay: Payer: Medicaid Other | Admitting: Anesthesiology

## 2016-02-14 ENCOUNTER — Inpatient Hospital Stay
Admission: RE | Admit: 2016-02-14 | Discharge: 2016-02-17 | DRG: 765 | Disposition: A | Discharge status: Home / Self Care | Payer: Medicaid Other | Source: Ambulatory Visit | Attending: Obstetrics and Gynecology | Admitting: Obstetrics and Gynecology

## 2016-02-14 ENCOUNTER — Encounter
Admission: RE | Disposition: A | Discharge status: Home / Self Care | Payer: Self-pay | Source: Ambulatory Visit | Attending: Obstetrics and Gynecology

## 2016-02-14 DIAGNOSIS — O34211 Maternal care for low transverse scar from previous cesarean delivery: Principal | ICD-10-CM | POA: Diagnosis present

## 2016-02-14 DIAGNOSIS — O9081 Anemia of the puerperium: Secondary | ICD-10-CM | POA: Diagnosis present

## 2016-02-14 DIAGNOSIS — D62 Acute posthemorrhagic anemia: Secondary | ICD-10-CM | POA: Diagnosis present

## 2016-02-14 DIAGNOSIS — Z369 Encounter for antenatal screening, unspecified: Secondary | ICD-10-CM

## 2016-02-14 DIAGNOSIS — F17210 Nicotine dependence, cigarettes, uncomplicated: Secondary | ICD-10-CM | POA: Diagnosis present

## 2016-02-14 DIAGNOSIS — O99334 Smoking (tobacco) complicating childbirth: Secondary | ICD-10-CM | POA: Diagnosis present

## 2016-02-14 DIAGNOSIS — Z3A39 39 weeks gestation of pregnancy: Secondary | ICD-10-CM | POA: Diagnosis not present

## 2016-02-14 DIAGNOSIS — O99354 Diseases of the nervous system complicating childbirth: Secondary | ICD-10-CM | POA: Diagnosis present

## 2016-02-14 DIAGNOSIS — G43909 Migraine, unspecified, not intractable, without status migrainosus: Secondary | ICD-10-CM | POA: Diagnosis present

## 2016-02-14 DIAGNOSIS — Z9889 Other specified postprocedural states: Secondary | ICD-10-CM

## 2016-02-14 LAB — CHLAMYDIA/NGC RT PCR (ARMC ONLY)
Chlamydia Tr: NOT DETECTED
N gonorrhoeae: NOT DETECTED

## 2016-02-14 SURGERY — Surgical Case
Anesthesia: Spinal | Laterality: Bilateral | Wound class: Clean Contaminated

## 2016-02-14 MED ORDER — PHENYLEPHRINE HCL 10 MG/ML IJ SOLN
INTRAMUSCULAR | Status: DC | PRN
  Administered 2016-02-14 (×13): 100 ug via INTRAVENOUS

## 2016-02-14 MED ORDER — PRENATAL MULTIVITAMIN CH
1.0000 | ORAL_TABLET | Freq: Every day | ORAL | Status: DC
  Administered 2016-02-15 – 2016-02-17 (×3): 1 via ORAL
  Filled 2016-02-14 (×3): qty 1

## 2016-02-14 MED ORDER — NALBUPHINE HCL 10 MG/ML IJ SOLN: 5.0000 mg | Freq: Once | INTRAMUSCULAR | Status: DC | PRN

## 2016-02-14 MED ORDER — NALOXONE HCL 2 MG/2ML IJ SOSY
1.0000 ug/kg/h | PREFILLED_SYRINGE | INTRAVENOUS | Status: DC | PRN
  Filled 2016-02-14: qty 2

## 2016-02-14 MED ORDER — BUPIVACAINE 0.25 % ON-Q PUMP DUAL CATH 400 ML
INJECTION | Status: AC
  Filled 2016-02-14: qty 400

## 2016-02-14 MED ORDER — CITRIC ACID-SODIUM CITRATE 334-500 MG/5ML PO SOLN
ORAL | Status: AC
  Administered 2016-02-14: 30 mL via ORAL
  Filled 2016-02-14: qty 15

## 2016-02-14 MED ORDER — SIMETHICONE 80 MG PO CHEW
80.0000 mg | CHEWABLE_TABLET | Freq: Three times a day (TID) | ORAL | Status: DC
  Administered 2016-02-15 – 2016-02-17 (×5): 80 mg via ORAL
  Filled 2016-02-14 (×5): qty 1

## 2016-02-14 MED ORDER — CEFAZOLIN SODIUM-DEXTROSE 2-4 GM/100ML-% IV SOLN
2.0000 g | INTRAVENOUS | Status: AC
  Administered 2016-02-14: 2 g via INTRAVENOUS

## 2016-02-14 MED ORDER — CITRIC ACID-SODIUM CITRATE 334-500 MG/5ML PO SOLN
30.0000 mL | ORAL | Status: AC
  Administered 2016-02-14: 30 mL via ORAL

## 2016-02-14 MED ORDER — FENTANYL CITRATE (PF) 100 MCG/2ML IJ SOLN
INTRAMUSCULAR | Status: DC | PRN
  Administered 2016-02-14: 15 ug via INTRATHECAL
  Administered 2016-02-14: 25 ug via INTRAVENOUS

## 2016-02-14 MED ORDER — NALOXONE HCL 0.4 MG/ML IJ SOLN: 0.4000 mg | INTRAMUSCULAR | Status: DC | PRN

## 2016-02-14 MED ORDER — DIPHENHYDRAMINE HCL 50 MG/ML IJ SOLN: 12.5000 mg | INTRAMUSCULAR | Status: DC | PRN

## 2016-02-14 MED ORDER — ACETAMINOPHEN 500 MG PO TABS: 1000.0000 mg | ORAL_TABLET | Freq: Four times a day (QID) | ORAL | Status: AC

## 2016-02-14 MED ORDER — DIPHENHYDRAMINE HCL 25 MG PO CAPS: 25.0000 mg | ORAL_CAPSULE | ORAL | Status: DC | PRN

## 2016-02-14 MED ORDER — SIMETHICONE 80 MG PO CHEW
80.0000 mg | CHEWABLE_TABLET | ORAL | Status: DC
  Administered 2016-02-15 – 2016-02-16 (×3): 80 mg via ORAL
  Filled 2016-02-14 (×3): qty 1

## 2016-02-14 MED ORDER — TETANUS-DIPHTH-ACELL PERTUSSIS 5-2.5-18.5 LF-MCG/0.5 IM SUSP
0.5000 mL | Freq: Once | INTRAMUSCULAR | Status: DC
  Filled 2016-02-14: qty 0.5

## 2016-02-14 MED ORDER — NALBUPHINE HCL 10 MG/ML IJ SOLN: 5.0000 mg | INTRAMUSCULAR | Status: DC | PRN

## 2016-02-14 MED ORDER — BUPIVACAINE HCL (PF) 0.5 % IJ SOLN
INTRAMUSCULAR | Status: DC | PRN
  Administered 2016-02-14: 30 mL

## 2016-02-14 MED ORDER — OXYTOCIN 40 UNITS IN LACTATED RINGERS INFUSION - SIMPLE MED
INTRAVENOUS | Status: DC | PRN
  Administered 2016-02-14: 1 mL via INTRAVENOUS

## 2016-02-14 MED ORDER — LANOLIN HYDROUS EX OINT: 1.0000 "application " | TOPICAL_OINTMENT | CUTANEOUS | Status: DC | PRN

## 2016-02-14 MED ORDER — KETOROLAC TROMETHAMINE 30 MG/ML IJ SOLN: 30.0000 mg | Freq: Four times a day (QID) | INTRAMUSCULAR | Status: AC | PRN

## 2016-02-14 MED ORDER — LACTATED RINGERS IV SOLN
INTRAVENOUS | Status: DC
  Administered 2016-02-14: 09:00:00 via INTRAVENOUS

## 2016-02-14 MED ORDER — LACTATED RINGERS IV SOLN: INTRAVENOUS | Status: DC

## 2016-02-14 MED ORDER — FENTANYL CITRATE (PF) 100 MCG/2ML IJ SOLN
INTRAMUSCULAR | Status: AC
  Administered 2016-02-14: 50 ug via INTRAVENOUS
  Filled 2016-02-14: qty 2

## 2016-02-14 MED ORDER — MENTHOL 3 MG MT LOZG
1.0000 | LOZENGE | OROMUCOSAL | Status: DC | PRN
  Filled 2016-02-14: qty 9

## 2016-02-14 MED ORDER — LACTATED RINGERS IV SOLN: Freq: Once | INTRAVENOUS | Status: DC

## 2016-02-14 MED ORDER — ONDANSETRON HCL 4 MG/2ML IJ SOLN: 4.0000 mg | Freq: Three times a day (TID) | INTRAMUSCULAR | Status: DC | PRN

## 2016-02-14 MED ORDER — CEFAZOLIN SODIUM-DEXTROSE 2-4 GM/100ML-% IV SOLN
INTRAVENOUS | Status: AC
  Filled 2016-02-14: qty 100

## 2016-02-14 MED ORDER — SODIUM CHLORIDE 0.9% FLUSH: 3.0000 mL | INTRAVENOUS | Status: DC | PRN

## 2016-02-14 MED ORDER — WITCH HAZEL-GLYCERIN EX PADS: 1.0000 "application " | MEDICATED_PAD | CUTANEOUS | Status: DC | PRN

## 2016-02-14 MED ORDER — LACTATED RINGERS IV BOLUS (SEPSIS)
1000.0000 mL | Freq: Once | INTRAVENOUS | Status: AC
  Administered 2016-02-14: 1000 mL via INTRAVENOUS

## 2016-02-14 MED ORDER — KETOROLAC TROMETHAMINE 30 MG/ML IJ SOLN
30.0000 mg | Freq: Four times a day (QID) | INTRAMUSCULAR | Status: AC | PRN
  Administered 2016-02-14 – 2016-02-15 (×3): 30 mg via INTRAVENOUS
  Filled 2016-02-14 (×2): qty 1

## 2016-02-14 MED ORDER — SIMETHICONE 80 MG PO CHEW
80.0000 mg | CHEWABLE_TABLET | ORAL | Status: DC | PRN
  Administered 2016-02-14: 80 mg via ORAL
  Filled 2016-02-14: qty 1

## 2016-02-14 MED ORDER — LACTATED RINGERS IV SOLN
INTRAVENOUS | Status: DC
  Administered 2016-02-14: 23:00:00 via INTRAVENOUS

## 2016-02-14 MED ORDER — ZOLPIDEM TARTRATE 5 MG PO TABS: 5.0000 mg | ORAL_TABLET | Freq: Every evening | ORAL | Status: DC | PRN

## 2016-02-14 MED ORDER — FENTANYL CITRATE (PF) 100 MCG/2ML IJ SOLN
25.0000 ug | INTRAMUSCULAR | Status: DC | PRN
  Administered 2016-02-14 (×2): 50 ug via INTRAVENOUS

## 2016-02-14 MED ORDER — DIBUCAINE 1 % RE OINT: 1.0000 "application " | TOPICAL_OINTMENT | RECTAL | Status: DC | PRN

## 2016-02-14 MED ORDER — BUPIVACAINE HCL (PF) 0.5 % IJ SOLN
INTRAMUSCULAR | Status: AC
  Filled 2016-02-14: qty 30

## 2016-02-14 MED ORDER — BUPIVACAINE IN DEXTROSE 0.75-8.25 % IT SOLN
INTRATHECAL | Status: DC | PRN
  Administered 2016-02-14: 1.8 mL via INTRATHECAL

## 2016-02-14 MED ORDER — OXYCODONE-ACETAMINOPHEN 5-325 MG PO TABS: 1.0000 | ORAL_TABLET | ORAL | Status: DC | PRN

## 2016-02-14 MED ORDER — IBUPROFEN 600 MG PO TABS
600.0000 mg | ORAL_TABLET | Freq: Four times a day (QID) | ORAL | Status: DC
  Administered 2016-02-15 – 2016-02-16 (×7): 600 mg via ORAL
  Filled 2016-02-14 (×7): qty 1

## 2016-02-14 MED ORDER — MORPHINE SULFATE (PF) 0.5 MG/ML IJ SOLN
INTRAMUSCULAR | Status: DC | PRN
  Administered 2016-02-14: .1 mg via EPIDURAL

## 2016-02-14 MED ORDER — DIPHENHYDRAMINE HCL 25 MG PO CAPS
25.0000 mg | ORAL_CAPSULE | Freq: Four times a day (QID) | ORAL | Status: DC | PRN
Start: 2016-02-14 — End: 2016-02-17

## 2016-02-14 MED ORDER — MEPERIDINE HCL 50 MG PO TABS: 100.0000 mg | ORAL_TABLET | ORAL | Status: DC | PRN

## 2016-02-14 MED ORDER — SENNOSIDES-DOCUSATE SODIUM 8.6-50 MG PO TABS
2.0000 | ORAL_TABLET | ORAL | Status: DC
  Administered 2016-02-17: 2 via ORAL
  Filled 2016-02-14 (×2): qty 2

## 2016-02-14 MED ORDER — OXYTOCIN 40 UNITS IN LACTATED RINGERS INFUSION - SIMPLE MED
2.5000 [IU]/h | INTRAVENOUS | Status: AC
  Administered 2016-02-14: 2.5 [IU]/h via INTRAVENOUS

## 2016-02-14 MED ORDER — MEPERIDINE HCL 50 MG PO TABS
100.0000 mg | ORAL_TABLET | ORAL | Status: DC | PRN
  Administered 2016-02-14 – 2016-02-16 (×12): 100 mg via ORAL
  Filled 2016-02-14 (×12): qty 2

## 2016-02-14 SURGICAL SUPPLY — 22 items
BARRIER ADHS 3X4 INTERCEED (GAUZE/BANDAGES/DRESSINGS) ×6 IMPLANT
CANISTER SUCT 3000ML (MISCELLANEOUS) ×3 IMPLANT
CATH KIT ON-Q SILVERSOAK 5IN (CATHETERS) ×6 IMPLANT
CHLORAPREP W/TINT 26ML (MISCELLANEOUS) ×3 IMPLANT
DRSG TELFA 3X8 NADH (GAUZE/BANDAGES/DRESSINGS) ×3 IMPLANT
ELECT CAUTERY BLADE 6.4 (BLADE) ×3 IMPLANT
ELECT REM PT RETURN 9FT ADLT (ELECTROSURGICAL) ×3
ELECTRODE REM PT RTRN 9FT ADLT (ELECTROSURGICAL) ×1 IMPLANT
GAUZE SPONGE 4X4 12PLY STRL (GAUZE/BANDAGES/DRESSINGS) ×3 IMPLANT
GLOVE BIO SURGEON STRL SZ8 (GLOVE) ×3 IMPLANT
GOWN STRL REUS W/ TWL LRG LVL3 (GOWN DISPOSABLE) ×2 IMPLANT
GOWN STRL REUS W/ TWL XL LVL3 (GOWN DISPOSABLE) ×1 IMPLANT
GOWN STRL REUS W/TWL LRG LVL3 (GOWN DISPOSABLE) ×4
GOWN STRL REUS W/TWL XL LVL3 (GOWN DISPOSABLE) ×2
NS IRRIG 1000ML POUR BTL (IV SOLUTION) ×3 IMPLANT
PACK C SECTION AR (MISCELLANEOUS) ×3 IMPLANT
PAD OB MATERNITY 4.3X12.25 (PERSONAL CARE ITEMS) ×3 IMPLANT
PAD PREP 24X41 OB/GYN DISP (PERSONAL CARE ITEMS) ×3 IMPLANT
STRAP SAFETY BODY (MISCELLANEOUS) ×3 IMPLANT
SUT CHROMIC 1 CTX 36 (SUTURE) ×9 IMPLANT
SUT PLAIN GUT 0 (SUTURE) ×6 IMPLANT
SUT VIC AB 0 CT1 36 (SUTURE) ×6 IMPLANT

## 2016-02-14 NOTE — Brief Op Note (Signed)
02/14/2016  8:47 AM  PATIENT:  Jordan [name]  30 y.o. female  PRE-OPERATIVE DIAGNOSIS:  elective repeat,   POST-OPERATIVE DIAGNOSIS:  Elective repeat cesarean section  PROCEDURE:  Elective repeat LTCS SURGEON:  Surgeon(s) and Role:    Suzy Bouchard* Thomas J Schermerhorn, MD - Primary  PHYSICIAN ASSISTANT: Sigmon   ASSISTANTS:  McCool  ANESTHESIA:  spinal EBL:  Total I/O In: 1000 [I.V.:1000] Out: 400 [Blood:400]  BLOOD ADMINISTERED:none  DRAINS: Urinary Catheter (Foley)   LOCAL MEDICATIONS USED:  MARCAINE     SPECIMEN:  No Specimen  DISPOSITION OF SPECIMEN:  N/A  COUNTS:  YES  TOURNIQUET:  * No tourniquets in log *  DICTATION: .Other Dictation: Dictation Number verbal  PLAN OF CARE: Admit to inpatient   PATIENT DISPOSITION:  PACU - hemodynamically stable.   Delay start of Pharmacological VTE agent (>24hrs) due to surgical blood loss or risk of bleeding: not applicable

## 2016-02-14 NOTE — Anesthesia Preprocedure Evaluation (Addendum)
Anesthesia Evaluation  Patient identified by MRN, date of birth, ID band Patient awake    Reviewed: Allergy & Precautions, H&P , NPO status , Patient's Chart, lab work & pertinent test results  History of Anesthesia Complications Negative for: history of anesthetic complications  Airway Mallampati: II  TM Distance: >3 FB Neck ROM: full    Dental  (+) Poor Dentition   Pulmonary neg pulmonary ROS, neg shortness of breath, asthma , Current Smoker,    Pulmonary exam normal breath sounds clear to auscultation       Cardiovascular Exercise Tolerance: Good (-) hypertension(-) angina(-) Past MI and (-) PND negative cardio ROS Normal cardiovascular exam+ Valvular Problems/Murmurs  Rhythm:regular Rate:Normal     Neuro/Psych  Headaches, PSYCHIATRIC DISORDERS Anxiety    GI/Hepatic Neg liver ROS, GERD  Controlled,  Endo/Other  negative endocrine ROS  Renal/GU negative Renal ROS  negative genitourinary   Musculoskeletal   Abdominal   Peds  Hematology negative hematology ROS (+)   Anesthesia Other Findings Past Medical History:   Interstitial cystitis                                        Heart murmur                                    21 years *   Interstitial cystitis                           20 years *   Placenta previa                                              Restless leg syndrome                                        Asthma                                                         Comment:childhood asthma   Anxiety                                                      Headache                                                     Anemia  Past Surgical History:   CESAREAN SECTION                                 2011/2013    DILATION AND CURETTAGE OF UTERUS                             BMI    Body Mass Index   24.01 kg/m 2    Patient reports no problems with  oxycodone  Reproductive/Obstetrics (+) Pregnancy                            Anesthesia Physical Anesthesia Plan  ASA: III  Anesthesia Plan: Spinal   Post-op Pain Management:    Induction:   Airway Management Planned:   Additional Equipment:   Intra-op Plan:   Post-operative Plan:   Informed Consent: I have reviewed the patients History and Physical, chart, labs and discussed the procedure including the risks, benefits and alternatives for the proposed anesthesia with the patient or authorized representative who has indicated his/her understanding and acceptance.   Dental Advisory Given  Plan Discussed with: Anesthesiologist, CRNA and Surgeon  Anesthesia Plan Comments:         Anesthesia Quick Evaluation

## 2016-02-14 NOTE — Transfer of Care (Signed)
Immediate Anesthesia Transfer of Care Note  Patient: Jordan [name]  Procedure(s) Performed: Procedure(s): CESAREAN SECTION (Bilateral)  Patient Location: PACU and Mother/Baby  Anesthesia Type:Spinal  Level of Consciousness: awake  Airway & Oxygen Therapy: Patient Spontanous Breathing  Post-op Assessment: Report given to RN  Post vital signs: stable  Last Vitals:  Filed Vitals:   02/14/16 0603  BP: 102/51  Pulse: 68  Temp: 36.8 C  Resp: 19    Complications: No apparent anesthesia complications

## 2016-02-14 NOTE — Progress Notes (Signed)
Pt to OR.

## 2016-02-14 NOTE — Progress Notes (Signed)
Patient ID: Jordan [name], female   DOB: 09-Oct-1986, 30 y.o.   MRN: [id number] scheduled for Repeat LTCS + BTL . Pt now declines the sterilization after much discussion .  Marland Kitchen. All questions answered . Proceed with LTCS and On q pump

## 2016-02-14 NOTE — Brief Op Note (Signed)
Patient has piercing in tongue that she cannot get out.  Anesthesia aware, discussed risk with her.

## 2016-02-14 NOTE — Brief Op Note (Signed)
Patient decided against tubaligation.

## 2016-02-14 NOTE — Anesthesia Procedure Notes (Addendum)
Spinal Patient location during procedure: OR Start time: 02/14/2016 7:52 AM End time: 02/14/2016 7:53 AM Staffing Anesthesiologist: Katy Fitch K Performed by: anesthesiologist  Preanesthetic Checklist Completed: patient identified, site marked, surgical consent, pre-op evaluation, timeout performed, IV checked, risks and benefits discussed and monitors and equipment checked Spinal Block Patient position: sitting Prep: Betadine Patient monitoring: heart rate, continuous pulse ox, blood pressure and cardiac monitor Approach: midline Location: L3-4 Injection technique: single-shot Needle Needle type: Whitacre and Introducer  Needle gauge: 24 G Needle length: 9 cm Assessment Sensory level: T4 Additional Notes Negative paresthesia. Negative blood return. Positive free-flowing CSF. Expiration date of kit checked and confirmed. Patient tolerated procedure well, without complications.

## 2016-02-15 ENCOUNTER — Other Ambulatory Visit: Payer: Medicaid Other

## 2016-02-15 LAB — CBC
HEMATOCRIT: 30.5 % — AB (ref 35.0–47.0)
HEMOGLOBIN: 10.1 g/dL — AB (ref 12.0–16.0)
MCH: 26.9 pg (ref 26.0–34.0)
MCHC: 33.2 g/dL (ref 32.0–36.0)
MCV: 81 fL (ref 80.0–100.0)
Platelets: 234 10*3/uL (ref 150–440)
RBC: 3.76 MIL/uL — ABNORMAL LOW (ref 3.80–5.20)
RDW: 13.8 % (ref 11.5–14.5)
WBC: 12.4 10*3/uL — ABNORMAL HIGH (ref 3.6–11.0)

## 2016-02-15 NOTE — Op Note (Signed)
Jordan [name], Jordan [name]               ACCOUNT NO.:  [id number]  MEDICAL RECORD NO.:  [id number]  LOCATION:                               FACILITY:  [facility]  PHYSICIAN:  Jennell Corner, MDDATE OF BIRTH:  04-03-1986  DATE OF PROCEDURE:  02/14/2016 DATE OF DISCHARGE:                              OPERATIVE REPORT   PREOPERATIVE DIAGNOSIS: 1. Elective repeat cesarean section. 2. 39+ 2 weeks estimated gestational age.  POSTOPERATIVE DIAGNOSIS: 1. Elective repeat cesarean section. 2. 39+ 2 weeks estimated gestational age.  PROCEDURE PERFORMED: 1. Repeat low transverse cesarean section. 2. On-Q pump placement.  SURGEON:  Jennell Corner, MD  ANESTHESIA:  Spinal.  SURGEON:  Jennell Corner, MD  FIRST ASSISTANTIdelle Jo.  SECOND ASSISTANT:  Arts administrator, PA student.  INDICATION:  This is a 30 year old, gravida 5, para 2, at 39+ 2 weeks estimated gestational age.  Patient has elected for repeat cesarean section.  DESCRIPTION OF PROCEDURE:  After adequate spinal anesthesia, patient was placed in dorsal supine position.  Hip rolled on the right side. Patient previously received 2 g IV Ancef.  The patient was prepped and draped in sterile fashion.  Time-out was performed.  A Pfannenstiel incision was then made 2 fingerbreadths above the symphysis pubis. Sharp dissection was used to identify the fascia.  Fascia was opened in midline and opened in a transverse fashion.  Superior aspect of the fascia was grasped with Kocher clamps and the recti muscles dissected free.  Inferior aspect of the fascia was grasped with Kocher clamps and pyramidalis muscles dissected free.  Peritoneal cavity was opened sharply.  Vesicouterine peritoneal fold was identified.  Bladder flap was created.  Low transverse uterine incision was made upon entry into the endometrial cavity.  Clear fluid resulted.  The incision was extended with blunt transverse traction.  Fetal head, shoulders, and body  delivered without difficulty.  Baby was viable female baby, was dried on the abdomen while delayed cord clamping was allowed for 60 seconds. Cord was then clamped and female was passed to nursery staff who assigned Apgar scores of 9 and 9.  Placenta was manually delivered and the uterus was exteriorized.  Endometrial cavity was wiped clean with laparotomy tape and ring forceps was used to open the cervix.  This was passed off the operative field.  Uterine incision was then closed with 1 chromic suture in a running, locking fashion, good approximation of edges. Posterior cul-de-sac was suctioned, was irrigated and suctioned and the uterus was placed back into the abdominal cavity.  The paracolic gutters were wiped clean with laparotomy tape and the uterine incision again appeared hemostatic.  Interceed was placed on the incision in a T-shaped fashion.  The superior aspect of the fascia was regrasped and the on-Q pump catheters were advanced from an infraumbilical position to a subfascial position.  The fascia was then closed over top the catheters with 0 Vicryl suture in a running, nonlocking fashion.  Good approximation of the edges.  Good hemostasis was noted.  Subcutaneous tissues were irrigated and bovied for hemostasis and the skin was reapproximated with staples.  The On-Q pump catheters were secured at the skin level with LiquiBand and Steri-Strip to  the skin and Tegaderm placed over top of these each catheter, was loaded with 5 mL of 0.5% Marcaine.  There were no complications.  ESTIMATED BLOOD LOSS:  400 mL.  INTRAOPERATIVE FLUIDS:  1000 mL.  Patient tolerated procedure and was taken to recovery room in good condition.          ______________________________ Jennell Cornerhomas Cena Bruhn, MD     TS/MEDQ  D:  02/14/2016  T:  02/15/2016  Job:  [id number]

## 2016-02-15 NOTE — Anesthesia Postprocedure Evaluation (Signed)
Anesthesia Post Note  Patient: Jordan [name]  Procedure(s) Performed: Procedure(s) (LRB): CESAREAN SECTION (Bilateral)  Patient location during evaluation: Mother Baby Anesthesia Type: Spinal Level of consciousness: awake and alert Pain management: satisfactory to patient Vital Signs Assessment: post-procedure vital signs reviewed and stable Respiratory status: spontaneous breathing Cardiovascular status: stable Postop Assessment: no headache, no backache, spinal receding, patient able to bend at knees and no signs of nausea or vomiting Anesthetic complications: no    Last Vitals:  Filed Vitals:   02/14/16 2359 02/15/16 0451  BP: 102/57 103/69  Pulse: 72 66  Temp: 36.5 C 36.6 C  Resp: 18 18    Last Pain:  Filed Vitals:   02/15/16 0616  PainSc: 5                  Jules SchickLogan,  Kimori Tartaglia P

## 2016-02-15 NOTE — Anesthesia Post-op Follow-up Note (Signed)
  Anesthesia Pain Follow-up Note  Patient: Jordan [name]  Day #: 1  Date of Follow-up: 02/15/2016 Time: 7:26 AM  Last Vitals:  Filed Vitals:   02/14/16 2359 02/15/16 0451  BP: 102/57 103/69  Pulse: 72 66  Temp: 36.5 C 36.6 C  Resp: 18 18    Level of Consciousness: alert  Pain: 3 /10   Side Effects:None  Catheter Site Exam:clean  Plan: D/C from anesthesia care  Jules SchickLogan,  Aizlyn Schifano P

## 2016-02-15 NOTE — Progress Notes (Signed)
  Subjective:  Doing well, Bottle feeding. Wants 10 year IUD at 6 weeks   Objective:  Blood pressure 103/68, pulse 68, temperature 97.7 F (36.5 C), temperature source Oral, resp. rate 16, height 5\' 4"  (1.626 m), weight 140 lb (63.504 kg), last menstrual period 05/25/2015, SpO2 100 %, unknown if currently breastfeeding.  General: NAD Pulmonary: no increased work of breathing. Lungs CTA bilat, NO W/R/R.  HEART: S1S2, RRR, no M/R/G.  Abdomen: non-distended, non-tender, fundus firm at +2 level of umbilicus, area of air vs. Bowel with air palp above the umbilicus. NT. Incision: staples intact with on-Q Extremities: no edema, no erythema, no tenderness Taking po well and voiding Results for orders placed or performed during the hospital encounter of 02/14/16 (from the past 72 hour(s))  Chlamydia/NGC rt PCR ([facility] only)     Status: None   Collection Time: 02/14/16  3:56 PM  Result Value Ref Range   Specimen source GC/Chlam URINE, RANDOM    Chlamydia Tr NOT DETECTED NOT DETECTED   N gonorrhoeae NOT DETECTED NOT DETECTED    Comment: (NOTE) 100  This methodology has not been evaluated in pregnant women or in 200  patients with a history of hysterectomy. 300 400  This methodology will not be performed on patients less than 8114  years of age.   CBC     Status: Abnormal   Collection Time: 02/15/16  6:00 AM  Result Value Ref Range   WBC 12.4 (H) 3.6 - 11.0 K/uL   RBC 3.76 (L) 3.80 - 5.20 MIL/uL   Hemoglobin 10.1 (L) 12.0 - 16.0 g/dL   HCT 16.130.5 (L) 09.635.0 - 04.547.0 %   MCV 81.0 80.0 - 100.0 fL   MCH 26.9 26.0 - 34.0 pg   MCHC 33.2 32.0 - 36.0 g/dL   RDW 40.913.8 81.111.5 - 91.414.5 %   Platelets 234 150 - 440 K/uL     Assessment:   30 y.o. [id number] postoperativeday # 1 stable, Incision C/D/I with staples, ON-Q pump intact.    Plan:  1) Acute blood loss anemia - hemodynamically stable and asymptomatic - po ferrous sulfate  2) --/--/A POS (04/07 1351) /   Varicella non-immune  3) TDAP status: Give  if not given at 27 weeks  4) Bottle/Contraception:IUD planned. BTL cancelled  5) Disposition: Continue orders, DC IV.

## 2016-02-16 MED ORDER — MEPERIDINE HCL 50 MG PO TABS
100.0000 mg | ORAL_TABLET | ORAL | Status: DC | PRN
  Administered 2016-02-16 – 2016-02-17 (×3): 100 mg via ORAL
  Filled 2016-02-16 (×3): qty 2

## 2016-02-16 MED ORDER — IBUPROFEN 600 MG PO TABS
600.0000 mg | ORAL_TABLET | Freq: Four times a day (QID) | ORAL | Status: DC
  Administered 2016-02-17 (×3): 600 mg via ORAL
  Filled 2016-02-16 (×3): qty 1

## 2016-02-16 NOTE — Progress Notes (Signed)
  Subjective:   Doing well, wants to go home in am. Taking po well and voiding well.   Objective:  Blood pressure 97/52, pulse 63, temperature 98.3 F (36.8 C), temperature source Oral, resp. rate 18, height 5\' 4"  (1.626 m), weight 140 lb (63.504 kg), last menstrual period 05/25/2015, SpO2 100 %, unknown if currently breastfeeding.  General: NAD, A&O x 3.  Pulmonary: no increased work of breathing, NO W/R/R. Heart: S1S2, RRR, No M/R/G.  Abdomen: non-distended, non-tender, fundus firm at level of umbilicus Incision: staples intact. On Q pump working well for pt.  Extremities: no edema, no erythema, no tenderness, Neg Homans sign.   Results for orders placed or performed during the hospital encounter of 02/14/16 (from the past 72 hour(s))  Chlamydia/NGC rt PCR ([facility] only)     Status: None   Collection Time: 02/14/16  3:56 PM  Result Value Ref Range   Specimen source GC/Chlam URINE, RANDOM    Chlamydia Tr NOT DETECTED NOT DETECTED   N gonorrhoeae NOT DETECTED NOT DETECTED    Comment: (NOTE) 100  This methodology has not been evaluated in pregnant women or in 200  patients with a history of hysterectomy. 300 400  This methodology will not be performed on patients less than 5914  years of age.   CBC     Status: Abnormal   Collection Time: 02/15/16  6:00 AM  Result Value Ref Range   WBC 12.4 (H) 3.6 - 11.0 K/uL   RBC 3.76 (L) 3.80 - 5.20 MIL/uL   Hemoglobin 10.1 (L) 12.0 - 16.0 g/dL   HCT 16.130.5 (L) 09.635.0 - 04.547.0 %   MCV 81.0 80.0 - 100.0 fL   MCH 26.9 26.0 - 34.0 pg   MCHC 33.2 32.0 - 36.0 g/dL   RDW 40.913.8 81.111.5 - 91.414.5 %   Platelets 234 150 - 440 K/uL     Assessment:   30 y.o. [id number] postoperativeday # 2   Plan:  1) Acute blood loss anemia - hemodynamically stable and asymptomatic - po ferrous sulfate  2) Continue orders  3) DC in am  4) Breast/Bottle/Contraception  5) Disposition

## 2016-02-17 MED ORDER — MEPERIDINE HCL 50 MG PO TABS: ORAL_TABLET | ORAL | Status: DC

## 2016-02-17 NOTE — Progress Notes (Signed)
Patient discharged home with infant and family. Discharge instructions, prescriptions and follow up appointment given to and reviewed with patient and family. Patient verbalized understanding. Escorted out via wheelchair by axiliary.  

## 2016-02-17 NOTE — Discharge Summary (Addendum)
Obstetric Discharge Summary   Patient ID: Jordan [name] MRN: [id number] DOB/AGE: 05-27-1986 30 y.o.   Date of Admission: 02/14/2016  Date of Discharge: 02/17/16  Admitting Diagnosis: Scheduled cesarean section at 3179w2d  Secondary Diagnosis: Tobacco Abuse, Migraines, Palpatations  Mode of Delivery: repeat cesarean sectionlow uterine, transverse     Discharge Diagnosis: Scheduled LTCS   Intrapartum Procedures: planned LTCS with BTL but, pt changed mind and BTL not done   Post partum procedures: none  Complications: none   Brief Hospital Course 3 day course of post-op care with ON-Q and po Demerol, pt stable, afebrile and bleeding controlled with no S/S infection     (Cesarean Section): Jordan [name] is a [id number] who underwent cesarean section on 02/14/2016.  Patient had an uncomplicated surgery; for further details of this surgery, please refer to the operative note.  Patient had an uncomplicated postpartum course.  By time of discharge on POD#3, her pain was controlled on oral pain medications; she had appropriate lochia and was ambulating, voiding without difficulty, tolerating regular diet and passing flatus.   She was deemed stable for discharge to home.    Labs: CBC Latest Ref Rng 02/15/2016 02/11/2016 02/03/2016  WBC 3.6 - 11.0 K/uL 12.4(H) 12.6(H) 13.4(H)  Hemoglobin 12.0 - 16.0 g/dL 10.1(L) 12.2 11.3(L)  Hematocrit 35.0 - 47.0 % 30.5(L) 37.1 34.0(L)  Platelets 150 - 440 K/uL 234 305 290   A POS  Physical exam:  Blood pressure 118/77, pulse 58, temperature 98 F (36.7 C), temperature source Oral, resp. rate 14, height 5\' 4"  (1.626 m), weight 140 lb (63.504 kg), last menstrual period 05/25/2015, SpO2 100 %, unknown if currently breastfeeding. General: alert and no distress Lochia: appropriate Abdomen: soft, NT Uterine Fundus: firm Incision: healing well, no significant drainage, no dehiscence, no significant erythema, Staples intact Extremities: No evidence of DVT seen  on physical exam. No lower extremity edema.  Discharge Instructions: Per After Visit Summary. Activity: Advance as tolerated. Pelvic rest for 6 weeks.  Also refer to After Visit Summary Diet: Regular Medications:   Medication List    ASK your doctor about these medications        Magnesium 250 MG Tabs  Take 250 mg by mouth 3 (three) times daily.     Prenatal Vitamins 0.8 MG tablet  Take 1 tablet by mouth daily.     promethazine 25 MG tablet  Commonly known as:  PHENERGAN  Take 0.5 tablets (12.5 mg total) by mouth every 6 (six) hours as needed for nausea or vomiting.       Outpatient follow up: 1-2 weeks with Dr Felicita GageJS Postpartum contraception: IUD  Discharged Condition: stable  Discharged to: home   Newborn Data: Stable  Baby Boy named Jordan [name]  Disposition:home with mother  Apgars: APGAR (1 MIN): 9   APGAR (5 MINS): 9   APGAR (10 MINS):    Baby Feeding: Bottle  Sharee Pimplearon W Aldina Porta, CNM 02/17/2016

## 2016-04-04 ENCOUNTER — Inpatient Hospital Stay: Admission: RE | Admit: 2016-04-04 | Payer: Medicaid Other | Source: Ambulatory Visit

## 2016-04-10 ENCOUNTER — Ambulatory Visit
Admission: RE | Admit: 2016-04-10 | Payer: Medicaid Other | Source: Ambulatory Visit | Admitting: Obstetrics and Gynecology

## 2016-04-10 ENCOUNTER — Encounter: Admission: RE | Payer: Self-pay | Source: Ambulatory Visit

## 2016-04-10 ENCOUNTER — Other Ambulatory Visit: Payer: Medicaid Other

## 2016-04-10 SURGERY — LIGATION, FALLOPIAN TUBE, LAPAROSCOPIC
Anesthesia: General | Laterality: Bilateral

## 2016-05-08 ENCOUNTER — Emergency Department
Admission: EM | Admit: 2016-05-08 | Discharge: 2016-05-08 | Disposition: A | Discharge status: Home / Self Care | Payer: No Typology Code available for payment source | Attending: Emergency Medicine | Admitting: Emergency Medicine

## 2016-05-08 DIAGNOSIS — T39011A Poisoning by aspirin, accidental (unintentional), initial encounter: Secondary | ICD-10-CM

## 2016-05-08 DIAGNOSIS — F17210 Nicotine dependence, cigarettes, uncomplicated: Secondary | ICD-10-CM | POA: Insufficient documentation

## 2016-05-08 DIAGNOSIS — J45909 Unspecified asthma, uncomplicated: Secondary | ICD-10-CM | POA: Insufficient documentation

## 2016-05-08 DIAGNOSIS — Y69 Unspecified misadventure during surgical and medical care: Secondary | ICD-10-CM | POA: Insufficient documentation

## 2016-05-08 LAB — SALICYLATE LEVEL: SALICYLATE LVL: 10.8 mg/dL (ref 2.8–30.0)

## 2016-05-08 NOTE — ED Provider Notes (Signed)
[facility] Emergency Department Provider Note  ____________________________________________    I have reviewed the triage vital signs and the nursing notes.   HISTORY  Chief Complaint Ingestion    HPI Jordan [name] is a 30 y.o. female who presents with concerns about possibly taking too much aspirin. Patient 4 she had a migraine today and took 01/07/2023 milligram aspirins and also took 2 extra strength Excedrins. Patient reports her headache is improved significantly and she is feeling well. She feels mildly nauseous but improved from earlier. No shortness of breath or dizziness.   Past Medical History  Diagnosis Date  . Interstitial cystitis   . Heart murmur 30 years old  . Interstitial cystitis 30 years old  . Placenta previa   . Restless leg syndrome   . Asthma     childhood asthma  . Anxiety   . Headache   . Anemia     Patient Active Problem List   Diagnosis Date Noted  . Post-operative state 02/14/2016  . First trimester screening 08/23/2015    Past Surgical History  Procedure Laterality Date  . Cesarean section  2011/2013  . Dilation and curettage of uterus    . Cesarean section with bilateral tubal ligation Bilateral 02/14/2016    Procedure: CESAREAN SECTION;  Surgeon: Suzy Bouchardhomas J Schermerhorn, MD;  Location: [facility] ORS;  Service: Obstetrics;  Laterality: Bilateral;    Current Outpatient Rx  Name  Route  Sig  Dispense  Refill  . meperidine (DEMEROL) 50 MG tablet      Take one to two tablets po every 4 to 6 hours prn pain approved by Dr Feliberto GottronSchermerhorn   30 tablet   0   . Prenatal Multivit-Min-Fe-FA (PRENATAL VITAMINS) 0.8 MG tablet   Oral   Take 1 tablet by mouth daily.   10 tablet   0     Allergies Vicodin and Amoxicillin  No family history on file.  Social History Social History  Substance Use Topics  . Smoking status: Current Every Day Smoker -- 1.00 packs/day    Types: Cigarettes  . Smokeless tobacco: Never Used   . Alcohol Use: No    Review of Systems  Constitutional: Negative for Dizziness Eyes: Negative for change in vision  Cardiovascular: Negative for chest pain or palpitations Respiratory: Negative for shortness of breath. Gastrointestinal: Mild nausea    Neurological: Headache has improved Psychiatric: no anxiety    ____________________________________________   PHYSICAL EXAM:  VITAL SIGNS: ED Triage Vitals  Enc Vitals Group     BP 05/08/16 2029 107/66 mmHg     Pulse Rate 05/08/16 2029 70     Resp 05/08/16 2029 14     Temp 05/08/16 2029 99 F (37.2 C)     Temp src --      SpO2 05/08/16 2029 100 %     Weight 05/08/16 2029 110 lb (49.896 kg)     Height 05/08/16 2029 5\' 4"  (1.626 m)     Head Cir --      Peak Flow --      Pain Score 05/08/16 2029 2     Pain Loc --      Pain Edu? --      Excl. in GC? --      Constitutional: Alert and oriented. Well appearing and in no distress.  Eyes: Conjunctivae are normal. No erythema or injection ENT   Head: Normocephalic and atraumatic.   Mouth/Throat: Mucous membranes are moist. Cardiovascular: Normal rate, regular rhythm.  Respiratory:  Normal respiratory effort without tachypnea nor retractions.   Genitourinary: deferred Musculoskeletal: Nontender with normal range of motion in all extremities.  Neurologic:  Normal speech and language. No gross focal neurologic deficits are appreciated. Skin:  Skin is warm, dry and intact. No rash noted. Psychiatric: Mood and affect are normal. Patient exhibits appropriate insight and judgment.  ____________________________________________    LABS (pertinent positives/negatives)  Labs Reviewed  SALICYLATE LEVEL    ____________________________________________   EKG  None  ____________________________________________    RADIOLOGY  None  ____________________________________________   PROCEDURES  Procedure(s) performed: none  Critical Care  performed:none  ____________________________________________   INITIAL IMPRESSION / ASSESSMENT AND PLAN / ED COURSE  Pertinent labs & imaging results that were available during my care of the patient were reviewed by me and considered in my medical decision making (see chart for details).  We will check a salicylate level although I have a low suspicion for significant overdose given that the patient is well-appearing and in no distress.  Salicylate level is within normal range. Patient is well-appearing and in no distress. Stable for discharge  ____________________________________________   FINAL CLINICAL IMPRESSION(S) / ED DIAGNOSES  Final diagnoses:  Overdose of aspirin, accidental or unintentional, initial encounter          Jene Everyobert Marella Vanderpol, MD 05/08/16 2229

## 2016-05-08 NOTE — Discharge Instructions (Signed)

## 2016-05-08 NOTE — ED Notes (Signed)
Pt states had a migraine today and took 3-324mg  asa. Pt states then she took 2 extra strength excedrin, and 1 regular excedrin. Pt is concerned she may have ingested too much asa. Pt states has been vomiting but has improved headache.

## 2016-05-08 NOTE — ED Notes (Signed)
Pt states had a migraine today and took 3 x 324mg  asa. Pt states she also took 2 extra strength excedrin x 250, and 1 migraine excedrin x 250. Pt is concerned she may have ingested too much asa. Pt states has been vomiting but has improved headache.  She is saying her stomach is upset now but does not feel nauseaous like she did.  She is reporting no other pains at this time but a little pain behind her eyes when she turns her head.  She said she did go to a headache clinic when she had insurance and Fioricet worked for her but since she now has no insurance cannot afford it.

## 2016-05-16 ENCOUNTER — Encounter: Payer: Self-pay | Admitting: Emergency Medicine

## 2016-05-16 DIAGNOSIS — I959 Hypotension, unspecified: Secondary | ICD-10-CM | POA: Diagnosis present

## 2016-05-16 DIAGNOSIS — Z880 Allergy status to penicillin: Secondary | ICD-10-CM

## 2016-05-16 DIAGNOSIS — N760 Acute vaginitis: Secondary | ICD-10-CM | POA: Diagnosis present

## 2016-05-16 DIAGNOSIS — D62 Acute posthemorrhagic anemia: Secondary | ICD-10-CM | POA: Diagnosis present

## 2016-05-16 DIAGNOSIS — O039 Complete or unspecified spontaneous abortion without complication: Principal | ICD-10-CM | POA: Diagnosis present

## 2016-05-16 DIAGNOSIS — F17210 Nicotine dependence, cigarettes, uncomplicated: Secondary | ICD-10-CM | POA: Diagnosis present

## 2016-05-16 DIAGNOSIS — K5900 Constipation, unspecified: Secondary | ICD-10-CM | POA: Diagnosis present

## 2016-05-16 LAB — COMPREHENSIVE METABOLIC PANEL
ALK PHOS: 67 U/L (ref 38–126)
ALT: 31 U/L (ref 14–54)
AST: 27 U/L (ref 15–41)
Albumin: 4.4 g/dL (ref 3.5–5.0)
Anion gap: 6 (ref 5–15)
BILIRUBIN TOTAL: 0.2 mg/dL — AB (ref 0.3–1.2)
BUN: 6 mg/dL (ref 6–20)
CALCIUM: 9.2 mg/dL (ref 8.9–10.3)
CHLORIDE: 109 mmol/L (ref 101–111)
CO2: 24 mmol/L (ref 22–32)
CREATININE: 0.71 mg/dL (ref 0.44–1.00)
Glucose, Bld: 65 mg/dL (ref 65–99)
Potassium: 3.6 mmol/L (ref 3.5–5.1)
Sodium: 139 mmol/L (ref 135–145)
Total Protein: 7.3 g/dL (ref 6.5–8.1)

## 2016-05-16 LAB — URINALYSIS COMPLETE WITH MICROSCOPIC (ARMC ONLY)
Bacteria, UA: NONE SEEN
SPECIFIC GRAVITY, URINE: 1.027 (ref 1.005–1.030)
SQUAMOUS EPITHELIAL / LPF: NONE SEEN

## 2016-05-16 LAB — CBC
HCT: 36 % (ref 35.0–47.0)
Hemoglobin: 11.8 g/dL — ABNORMAL LOW (ref 12.0–16.0)
MCH: 26.7 pg (ref 26.0–34.0)
MCHC: 32.9 g/dL (ref 32.0–36.0)
MCV: 81.3 fL (ref 80.0–100.0)
PLATELETS: 275 10*3/uL (ref 150–440)
RBC: 4.43 MIL/uL (ref 3.80–5.20)
RDW: 15.1 % — AB (ref 11.5–14.5)
WBC: 9.7 10*3/uL (ref 3.6–11.0)

## 2016-05-16 LAB — LIPASE, BLOOD: Lipase: 24 U/L (ref 11–51)

## 2016-05-16 NOTE — ED Notes (Signed)
POCT RESULTS WERE ** POSITIVE** 

## 2016-05-16 NOTE — ED Notes (Signed)
Patient ambulatory to triage with steady gait, c/o vaginal bleeding and lower abdominal cramping. Pt reports had c-section in April. Pt states has been bleeding for 2 weeks, reports today "I felt like something bust and I started bleeding heavily and my stomach began cramping." Pt alert and oriented x 4, no increased noted in breathing.

## 2016-05-17 ENCOUNTER — Other Ambulatory Visit: Payer: Self-pay | Admitting: Obstetrics and Gynecology

## 2016-05-17 ENCOUNTER — Emergency Department: Payer: Self-pay

## 2016-05-17 ENCOUNTER — Encounter
Admission: EM | Disposition: A | Discharge status: Home / Self Care | Payer: Self-pay | Source: Home / Self Care | Attending: Obstetrics and Gynecology

## 2016-05-17 ENCOUNTER — Inpatient Hospital Stay
Admission: EM | Admit: 2016-05-17 | Discharge: 2016-05-18 | DRG: 779 | Disposition: A | Discharge status: Home / Self Care | Payer: Self-pay | Attending: Obstetrics and Gynecology | Admitting: Obstetrics and Gynecology

## 2016-05-17 DIAGNOSIS — O009 Unspecified ectopic pregnancy without intrauterine pregnancy: Secondary | ICD-10-CM

## 2016-05-17 DIAGNOSIS — R109 Unspecified abdominal pain: Secondary | ICD-10-CM | POA: Diagnosis present

## 2016-05-17 LAB — COMPREHENSIVE METABOLIC PANEL
ALBUMIN: 3.7 g/dL (ref 3.5–5.0)
ALK PHOS: 57 U/L (ref 38–126)
ALT: 25 U/L (ref 14–54)
AST: 22 U/L (ref 15–41)
Anion gap: 5 (ref 5–15)
BILIRUBIN TOTAL: 0.5 mg/dL (ref 0.3–1.2)
BUN: 6 mg/dL (ref 6–20)
CALCIUM: 8.7 mg/dL — AB (ref 8.9–10.3)
CO2: 24 mmol/L (ref 22–32)
CREATININE: 0.66 mg/dL (ref 0.44–1.00)
Chloride: 109 mmol/L (ref 101–111)
GFR calc Af Amer: >60 mL/min (ref >60)
GLUCOSE: 94 mg/dL (ref 65–99)
POTASSIUM: 3.5 mmol/L (ref 3.5–5.1)
Sodium: 138 mmol/L (ref 135–145)
TOTAL PROTEIN: 6.2 g/dL — AB (ref 6.5–8.1)

## 2016-05-17 LAB — WET PREP, GENITAL
Sperm: NONE SEEN
TRICH WET PREP: NONE SEEN
YEAST WET PREP: NONE SEEN

## 2016-05-17 LAB — CBC
HEMATOCRIT: 31.4 % — AB (ref 35.0–47.0)
HEMOGLOBIN: 10.4 g/dL — AB (ref 12.0–16.0)
MCH: 26.6 pg (ref 26.0–34.0)
MCHC: 33.1 g/dL (ref 32.0–36.0)
MCV: 80.4 fL (ref 80.0–100.0)
Platelets: 224 10*3/uL (ref 150–440)
RBC: 3.91 MIL/uL (ref 3.80–5.20)
RDW: 14.8 % — AB (ref 11.5–14.5)
WBC: 7.3 10*3/uL (ref 3.6–11.0)

## 2016-05-17 LAB — HCG, QUANTITATIVE, PREGNANCY
HCG, BETA CHAIN, QUANT, S: 20284 m[IU]/mL — AB (ref <5)
HCG, BETA CHAIN, QUANT, S: 10826 m[IU]/mL — AB (ref <5)

## 2016-05-17 LAB — CHLAMYDIA/NGC RT PCR (ARMC ONLY)
Chlamydia Tr: NOT DETECTED
N GONORRHOEAE: NOT DETECTED

## 2016-05-17 SURGERY — LAPAROSCOPY, DIAGNOSTIC
Anesthesia: Choice

## 2016-05-17 MED ORDER — DEXTROSE IN LACTATED RINGERS 5 % IV SOLN
INTRAVENOUS | Status: DC
  Administered 2016-05-17: 05:00:00 via INTRAVENOUS
  Filled 2016-05-17 (×2): qty 1000

## 2016-05-17 MED ORDER — AMMONIA AROMATIC IN INHA
RESPIRATORY_TRACT | Status: AC
  Filled 2016-05-17: qty 10

## 2016-05-17 MED ORDER — METRONIDAZOLE 500 MG PO TABS
500.0000 mg | ORAL_TABLET | Freq: Two times a day (BID) | ORAL | Status: DC
  Administered 2016-05-17 (×2): 500 mg via ORAL
  Filled 2016-05-17 (×2): qty 1

## 2016-05-17 MED ORDER — KETOROLAC TROMETHAMINE 30 MG/ML IJ SOLN
30.0000 mg | Freq: Four times a day (QID) | INTRAMUSCULAR | Status: DC
  Administered 2016-05-17: 30 mg via INTRAVENOUS
  Filled 2016-05-17: qty 1

## 2016-05-17 SURGICAL SUPPLY — 39 items
BAG URO DRAIN 2000ML W/SPOUT (MISCELLANEOUS) IMPLANT
BLADE SURG SZ11 CARB STEEL (BLADE) IMPLANT
CATH FOLEY 2WAY  5CC 16FR (CATHETERS)
CATH ROBINSON RED A/P 16FR (CATHETERS) IMPLANT
CATH URTH 16FR FL 2W BLN LF (CATHETERS) IMPLANT
CHLORAPREP W/TINT 26ML (MISCELLANEOUS) IMPLANT
CLOSURE WOUND 1/4X4 (GAUZE/BANDAGES/DRESSINGS)
DRSG TEGADERM 2-3/8X2-3/4 SM (GAUZE/BANDAGES/DRESSINGS) IMPLANT
ENDOPOUCH RETRIEVER 10 (MISCELLANEOUS) IMPLANT
GAUZE SPONGE NON-WVN 2X2 STRL (MISCELLANEOUS) IMPLANT
GLOVE BIO SURGEON STRL SZ 6.5 (GLOVE) IMPLANT
GLOVE BIO SURGEONS STRL SZ 6.5 (GLOVE)
GLOVE INDICATOR 7.0 STRL GRN (GLOVE) IMPLANT
GOWN STRL REUS W/ TWL LRG LVL3 (GOWN DISPOSABLE) IMPLANT
GOWN STRL REUS W/TWL LRG LVL3 (GOWN DISPOSABLE)
IRRIGATION STRYKERFLOW (MISCELLANEOUS) IMPLANT
IRRIGATOR STRYKERFLOW (MISCELLANEOUS)
IV SOD CHL 0.9% 1000ML (IV SOLUTION) IMPLANT
KIT RM TURNOVER CYSTO AR (KITS) IMPLANT
LABEL OR SOLS (LABEL) IMPLANT
LIGASURE 5MM LAPAROSCOPIC (INSTRUMENTS) IMPLANT
LIQUID BAND (GAUZE/BANDAGES/DRESSINGS) IMPLANT
NS IRRIG 500ML POUR BTL (IV SOLUTION) IMPLANT
PACK GYN LAPAROSCOPIC (MISCELLANEOUS) IMPLANT
PAD OB MATERNITY 4.3X12.25 (PERSONAL CARE ITEMS) IMPLANT
PAD PREP 24X41 OB/GYN DISP (PERSONAL CARE ITEMS) IMPLANT
SCISSORS METZENBAUM CVD 33 (INSTRUMENTS) IMPLANT
SLEEVE ENDOPATH XCEL 5M (ENDOMECHANICALS) IMPLANT
SPONGE VERSALON 2X2 STRL (MISCELLANEOUS)
STRIP CLOSURE SKIN 1/4X4 (GAUZE/BANDAGES/DRESSINGS) IMPLANT
SUT MNCRL AB 4-0 PS2 18 (SUTURE) IMPLANT
SUT VIC AB 2-0 UR6 27 (SUTURE) IMPLANT
SUT VIC AB 4-0 SH 27 (SUTURE)
SUT VIC AB 4-0 SH 27XANBCTRL (SUTURE) IMPLANT
SWABSTK COMLB BENZOIN TINCTURE (MISCELLANEOUS) IMPLANT
TROCAR ENDO BLADELESS 11MM (ENDOMECHANICALS) IMPLANT
TROCAR XCEL NON-BLD 5MMX100MML (ENDOMECHANICALS) IMPLANT
TROCAR XCEL UNIV SLVE 11M 100M (ENDOMECHANICALS) IMPLANT
TUBING INSUFFLATOR HI FLOW (MISCELLANEOUS) IMPLANT

## 2016-05-17 NOTE — Progress Notes (Signed)
Slightly pink-tinged

## 2016-05-17 NOTE — Progress Notes (Signed)
RN getting health history from patient; has 3 children (all 3 deliveries were via c-section); has interstitial cystitis (doesn't take meds now, "i can't afford it"); has a murmur (went to cardiologist "one time and he said i have a leaky valve"; uses medication discount cards to help with affording medications

## 2016-05-17 NOTE — Progress Notes (Unsigned)
Dr. Dalbert GarnetBeasley aware and will admit patient and come in to evaluate for poss surgery for ectopic. Pt is stable now

## 2016-05-17 NOTE — Progress Notes (Signed)
Notify Lodema Pilotobin Smith, RN of bp

## 2016-05-17 NOTE — Progress Notes (Signed)
Notified R Smith, Rn of bp

## 2016-05-17 NOTE — Progress Notes (Addendum)
Brief note: (Please see consult note by Milon Scorearon Jones, CNM)  30 with unknown LMP but hx of C/S x3, with last C/S in 02/2016 and with a planned tubal that she canceled, who presented to ER with vaginal bleeding and pelvic pain last night. Not on contraception, is sexually active.  [place] 20[address] Rh pos Hgb 11.8  TVUS with heterogenous clot in endometrial canal and cervix measuring 1cm, and possible hemorrhagic cyst on left ovary, but no evidence in pelvis of ectopic pregnancy or free fluid. Tubes not definitively visualized. I discussed with on call reading radiologist who confirmed these findings.  Per on call CNM, there is fullness and tenderness on right lateral vaginal sidewall and clots in the vaginal vault. Cervix is minimally open. She is not in significant pelvic pain.  A: Vaginal bleeding and pain with positive pregnancy test DDx includes incomplete AB, and less likely ectopic pregnancy  P: Admit for obs, with repeat serial pelvic exams. I will repeat her beta quant in 12 hrs or so for an idea of direction, and have low threshold for dx lap based on clinical sx. If incomplete AB, we will follow her beta quant to zero. If ectopic, would expect with a beta of [id number] for the ectopic to be present on TVUS.

## 2016-05-17 NOTE — Progress Notes (Signed)
Subjective: Patient reports minimal cramping occasionally, no trouble voiding, no dizziness or nausea, no headache, minimal vaginal bleeding- has not filled a pad in the last 8 hours and is wearing a pantyliner with a quarter sized clot now. She states the pain wrapped around to her back and down left leg, but now is mostly resolved. Some calf tenderness.  She states she last moved bowels >2 weeks ago. This is normal for her. She denies hard ball stools, nausea, pain with defecation, rectal bleeding.  She states she has a hx of low blood pressure, but not this low.  I witnessed her easily and quickly ambulating around the unit without concern.  Objective: I have reviewed patient's vital signs, intake and output, medications, labs, pathology and radiology results.  General: alert, cooperative, appears stated age and no distress Resp: clear to auscultation bilaterally Cardio: regular rate and rhythm, S1, S2 normal, no murmur, click, rub or gallop GI: soft, non-tender; bowel sounds normal; no masses,  no organomegaly. No rebound tenderness. No CVA tenderness Extremities: extremities normal, atraumatic, no cyanosis or edema - Homan's negative Vaginal Bleeding: minimal- no CMT, minimal uterine tenderness, trace blood in vault. Mild left sided pelvic tenderness along vaginal wall. Cervix is closed  Filed Vitals:   05/17/16 0700 05/17/16 1143  BP: 82/35 77/33  Pulse: 70 57  Temp: 98 F (36.7 C) 98.4 F (36.9 C)  Resp: 20 18   Recent Results (from the past 7416 hour(s))  Salicylate level     Status: None   Collection Time: 05/08/16  9:04 PM  Result Value Ref Range   Salicylate Lvl 38.4 2.8 - 30.0 mg/dL  Lipase, blood     Status: None   Collection Time: 05/16/16  8:56 PM  Result Value Ref Range   Lipase 24 11 - 51 U/L  Comprehensive metabolic panel     Status: Abnormal   Collection Time: 05/16/16  8:56 PM  Result Value Ref Range   Sodium 139 135 - 145 mmol/L   Potassium 3.6 3.5 - 5.1  mmol/L   Chloride 109 101 - 111 mmol/L   CO2 24 22 - 32 mmol/L   Glucose, Bld 65 65 - 99 mg/dL   BUN 6 6 - 20 mg/dL   Creatinine, Ser 0.71 0.44 - 1.00 mg/dL   Calcium 9.2 8.9 - 10.3 mg/dL   Total Protein 7.3 6.5 - 8.1 g/dL   Albumin 4.4 3.5 - 5.0 g/dL   AST 27 15 - 41 U/L   ALT 31 14 - 54 U/L   Alkaline Phosphatase 67 38 - 126 U/L   Total Bilirubin 0.2 (L) 0.3 - 1.2 mg/dL   GFR calc non Af Amer >60 >60 mL/min   GFR calc Af Amer >60 >60 mL/min    Comment: (NOTE) The eGFR has been calculated using the CKD EPI equation. This calculation has not been validated in all clinical situations. eGFR's persistently <60 mL/min signify possible Chronic Kidney Disease.    Anion gap 6 5 - 15  CBC     Status: Abnormal   Collection Time: 05/16/16  8:56 PM  Result Value Ref Range   WBC 9.7 3.6 - 11.0 K/uL   RBC 4.43 3.80 - 5.20 MIL/uL   Hemoglobin 11.8 (L) 12.0 - 16.0 g/dL   HCT 36.0 35.0 - 47.0 %   MCV 81.3 80.0 - 100.0 fL   MCH 26.7 26.0 - 34.0 pg   MCHC 32.9 32.0 - 36.0 g/dL   RDW 15.1 (H) 11.5 -  14.5 %   Platelets 275 150 - 440 K/uL  Urinalysis complete, with microscopic     Status: Abnormal   Collection Time: 05/16/16  8:56 PM  Result Value Ref Range   Color, Urine RED (A) YELLOW   APPearance TURBID (A) CLEAR   Glucose, UA (A) NEGATIVE mg/dL    TEST NOT REPORTED DUE TO COLOR INTERFERENCE OF URINE PIGMENT   Bilirubin Urine (A) NEGATIVE    TEST NOT REPORTED DUE TO COLOR INTERFERENCE OF URINE PIGMENT   Ketones, ur (A) NEGATIVE mg/dL    TEST NOT REPORTED DUE TO COLOR INTERFERENCE OF URINE PIGMENT   Specific Gravity, Urine 1.027 1.005 - 1.030   Hgb urine dipstick (A) NEGATIVE    TEST NOT REPORTED DUE TO COLOR INTERFERENCE OF URINE PIGMENT   pH  5.0 - 8.0    TEST NOT REPORTED DUE TO COLOR INTERFERENCE OF URINE PIGMENT   Protein, ur (A) NEGATIVE mg/dL    TEST NOT REPORTED DUE TO COLOR INTERFERENCE OF URINE PIGMENT   Nitrite (A) NEGATIVE    TEST NOT REPORTED DUE TO COLOR INTERFERENCE  OF URINE PIGMENT   Leukocytes, UA (A) NEGATIVE    TEST NOT REPORTED DUE TO COLOR INTERFERENCE OF URINE PIGMENT   RBC / HPF TOO NUMEROUS TO COUNT 0 - 5 RBC/hpf   WBC, UA TOO NUMEROUS TO COUNT 0 - 5 WBC/hpf   Bacteria, UA NONE SEEN NONE SEEN   Squamous Epithelial / LPF NONE SEEN NONE SEEN  hCG, quantitative, pregnancy     Status: Abnormal   Collection Time: 05/16/16  8:56 PM  Result Value Ref Range   hCG, Beta Chain, Quant, S 20284 (H) <5 mIU/mL    Comment:          GEST. AGE      CONC.  (mIU/mL)   <=1 WEEK        5 - 50     2 WEEKS       50 - 500     3 WEEKS       100 - 10,000     4 WEEKS     1,000 - 30,000     5 WEEKS     3,500 - 115,000   6-8 WEEKS     12,000 - 270,000    12 WEEKS     15,000 - 220,000        FEMALE AND NON-PREGNANT FEMALE:     LESS THAN 5 mIU/mL   Wet prep, genital     Status: Abnormal   Collection Time: 05/17/16  4:44 AM  Result Value Ref Range   Yeast Wet Prep HPF POC NONE SEEN NONE SEEN   Trich, Wet Prep NONE SEEN NONE SEEN   Clue Cells Wet Prep HPF POC PRESENT (A) NONE SEEN   WBC, Wet Prep HPF POC MODERATE (A) NONE SEEN   Sperm NONE SEEN   Chlamydia/NGC rt PCR ([facility] only)     Status: None   Collection Time: 05/17/16  4:44 AM  Result Value Ref Range   Specimen source GC/Chlam ENDOCERVICAL    Chlamydia Tr NOT DETECTED NOT DETECTED   N gonorrhoeae NOT DETECTED NOT DETECTED    Comment: (NOTE) 100  This methodology has not been evaluated in pregnant women or in 200  patients with a history of hysterectomy. 300 400  This methodology will not be performed on patients less than 54  years of age.   hCG, quantitative, pregnancy     Status:  Abnormal   Collection Time: 05/17/16 10:46 AM  Result Value Ref Range   hCG, Beta Chain, Quant, S 10826 (H) <5 mIU/mL    Comment:          GEST. AGE      CONC.  (mIU/mL)   <=1 WEEK        5 - 50     2 WEEKS       50 - 500     3 WEEKS       100 - 10,000     4 WEEKS     1,000 - 30,000     5 WEEKS     3,500 - 115,000    6-8 WEEKS     12,000 - 270,000    12 WEEKS     15,000 - 220,000        FEMALE AND NON-PREGNANT FEMALE:     LESS THAN 5 mIU/mL      Assessment/Plan: 30yo [id number] at unknown LMP with a decreasing beta hcg, now mild pelvic crampiness and minimal vaginal bleeding.   1. DDx includes incomplete abortion, non-ruptured ectopic pregnancy, constipation. The evidence points to SAB, with rapidly dropping beta, minimal abdoinal tenderness and no peritoneal signs, with ultrasound neg for free fluid or adnexal masses, and with her current stable vitals and HR. - repeat beta hcg in am - repeat CBC at this time - continue Is/Os for monitoring  2. Hypotension: Pt is asx. However, DBP 33 just now, but HR is normal. Will continue serial vitals, advance diet, ask pt to ambulate. Will get orthostatic vitals now.  3. Calf tenderness: Likely msk from inactivity. O2 sats wnl. No evidence of DVT  4. Constipation: May explain tenderness in left pelvis more than uterine tenderness on exam. Recommendations ([email or web address].se and bowel diet) discussed. Upright flat plate considered, but as pt is asx. Enema offered and declined.  5. Undesired/unplanning pregnancy- Contraception: Options discussed. Vasectomy preferred by patient and boyfriend. Scripts offered.  6. DVT ppx- early and frequent ambulation  7. Vaginal infection: Bacterial vaginosis dx by wet mount last night. Flagyl x7 days given.  8. Advance to regular diet, ambulate as tolerated. Heplock IV. PO hydration encouraged.   LOS: 0 days    Yousof Alderman 05/17/2016, 12:41 PM

## 2016-05-17 NOTE — Consult Note (Signed)
OB ADMISSION/ HISTORY & PHYSICAL:  Admission Date: 05/17/2016 12:04 AM  Admit Diagnosis: Pelvic pain  Jordan [name] is a 30 y.o. female presenting for vaginal bleeding and Lt lower pelvic pain which now has become bilat starting at 1930.   GYN History: [id number]   Primary Ob Provider: [facility]  Prenatal Labs: ABO, Rh: --/--/A POS (04/07 1351) Antibody: NEG (04/07 1351) Rubella:   Immune RPR: Non Reactive (04/07 1351)  HBsAg: Negative (12/06 0000)  HIV: Non-reactive (12/06 0000)    Medical / Surgical History :  Past medical history:  Past Medical History  Diagnosis Date  . Interstitial cystitis   . Heart murmur 30 years old  . Interstitial cystitis 30 years old  . Placenta previa   . Restless leg syndrome   . Asthma     childhood asthma  . Anxiety   . Headache   . Anemia      Past surgical history:  Past Surgical History  Procedure Laterality Date  . Cesarean section  2011/2013  . Dilation and curettage of uterus    . Cesarean section with bilateral tubal ligation Bilateral 02/14/2016    Procedure: CESAREAN SECTION;  Surgeon: Suzy Bouchard, MD;  Location: [facility] ORS;  Service: Obstetrics;  Laterality: Bilateral;    Family History: No family history on file.   Social History:  reports that she has been smoking Cigarettes.  She has been smoking about 1.00 pack per day. She has never used smokeless tobacco. She reports that she does not drink alcohol or use illicit drugs.   Allergies: Vicodin and Amoxicillin    Current Medications at time of admission:  Prior to Admission medications   Not on File    Review of Systems: Review of Systems  Constitutional: Negative for fever, chills and malaise/fatigue.  HENT: Negative for ear pain and sore throat.   Eyes: Negative for blurred vision and pain.  Respiratory: Negative for cough and shortness of breath.   Cardiovascular: Negative for chest pain, palpitations and leg swelling.  Gastrointestinal: Positive for  abdominal pain. Negative for heartburn, nausea, vomiting, diarrhea, constipation and blood in stool.  Genitourinary: Negative for dysuria, urgency and frequency.  Musculoskeletal: Negative for back pain.  Skin: Negative for itching and rash.  Neurological: Negative for dizziness, weakness and headaches.  Psychiatric/Behavioral: Negative for depression.     Physical Exam:  VS: Blood pressure 101/52, pulse 60, temperature 98.5 F (36.9 C), temperature source Oral, resp. rate 16, height  (1.626 m), weight 110 lb (49.896 kg), last menstrual period 05/08/2016, SpO2 99 %, unknown if currently breastfeeding.  General: alert and oriented, appears uncomfortable at times with movement and with palpation Heart: RRR, No M/R/G. Lungs: Clear lung fields, no W/R/R. Abdomen: soft, flat, Tender to palpation in the Lt lower quadrant and radiates to the Rt lower quadrant Extremities:  Neg  edema  Genitalia / VE:   Pt placed in Lithotomy position. Speculum was inserted and mod bright red blood present in the vagina. Scopettes used to clean out the vagina. There was a blood clot hanging at the os and a sponge stick was used to remove. No tissue found only blood clot. Wet prep and GC/CH sent. Bi-manual: on Entry of digits, no pain elicted on the Rt side on midline but, when the Lt side of vaginal wall was palpated, there was pain and a firmness approx 7cms in length. Pt could not tolerate the exam along this area.   Assessment: 1. Lt ectopic pregnancy  2. IUP at 4-6 weeks with no identifiable pregnancy    Plan:  1. Admit for evaluation and poss surgery for ectopic.  Dr Bernestine AmassBeasely notified of admission / plan of care and orders received.  2. IV:D5LR at 125 ml/hr.  3. NPO

## 2016-05-17 NOTE — ED Provider Notes (Signed)
[facility] Emergency Department Provider Note  ____________________________________________  Time seen: 11:15 PM  I have reviewed the triage vital signs and the nursing notes.   HISTORY  Chief Complaint Vaginal Bleeding and Abdominal Pain      HPI Jordan [name] is a 30 y.o. female status post C-section April 2017 presents with pelvic pain predominantly on the left currently 4 out of 10. Patient also admits to irregular menses over the last 2 weeks with acute worsening today patient states that she used 2 pads and 4 hours which were saturated. Patient denies any chest pain no lightheadedness or dizziness. Patient denies any palpitations.    Past Medical History  Diagnosis Date  . Interstitial cystitis   . Heart murmur 30 years old  . Interstitial cystitis 30 years old  . Placenta previa   . Restless leg syndrome   . Asthma     childhood asthma  . Anxiety   . Headache   . Anemia     Patient Active Problem List   Diagnosis Date Noted  . Post-operative state 02/14/2016  . First trimester screening 08/23/2015    Past Surgical History  Procedure Laterality Date  . Cesarean section  2011/2013  . Dilation and curettage of uterus    . Cesarean section with bilateral tubal ligation Bilateral 02/14/2016    Procedure: CESAREAN SECTION;  Surgeon: Suzy Bouchardhomas J Schermerhorn, MD;  Location: [facility] ORS;  Service: Obstetrics;  Laterality: Bilateral;    Current Outpatient Rx  Name  Route  Sig  Dispense  Refill  . meperidine (DEMEROL) 50 MG tablet      Take one to two tablets po every 4 to 6 hours prn pain approved by Dr Feliberto GottronSchermerhorn   30 tablet   0   . Prenatal Multivit-Min-Fe-FA (PRENATAL VITAMINS) 0.8 MG tablet   Oral   Take 1 tablet by mouth daily.   10 tablet   0     Allergies Vicodin and Amoxicillin  No family history on file.  Social History Social History  Substance Use Topics  . Smoking status: Current Every Day Smoker -- 1.00  packs/day    Types: Cigarettes  . Smokeless tobacco: Never Used  . Alcohol Use: No    Review of Systems  Constitutional: Negative for fever. Eyes: Negative for visual changes. ENT: Negative for sore throat. Cardiovascular: Negative for chest pain. Respiratory: Negative for shortness of breath. Gastrointestinal: Negative for abdominal pain, vomiting and diarrhea. Genitourinary: Negative for dysuria. Musculoskeletal: Negative for back pain. Skin: Negative for rash. Neurological: Negative for headaches, focal weakness or numbness.   10-point ROS otherwise negative.  ____________________________________________   PHYSICAL EXAM:  VITAL SIGNS: ED Triage Vitals  Enc Vitals Group     BP 05/16/16 2033 97/53 mmHg     Pulse Rate 05/16/16 2033 72     Resp 05/16/16 2033 18     Temp 05/16/16 2033 98.5 F (36.9 C)     Temp Source 05/16/16 2033 Oral     SpO2 05/16/16 2033 100 %     Weight 05/16/16 2033 110 lb (49.896 kg)     Height 05/16/16 2033 5\' 4"  (1.626 m)     Head Cir --      Peak Flow --      Pain Score 05/16/16 2034 5     Pain Loc --      Pain Edu? --      Excl. in GC? --      Constitutional: Alert and oriented.  Well appearing and in no distress. Eyes: Conjunctivae are normal. PERRL. Normal extraocular movements. ENT   Head: Normocephalic and atraumatic.   Nose: No congestion/rhinnorhea.   Mouth/Throat: Mucous membranes are moist.   Neck: No stridor. Hematological/Lymphatic/Immunilogical: No cervical lymphadenopathy. Cardiovascular: Normal rate, regular rhythm. Normal and symmetric distal pulses are present in all extremities. No murmurs, rubs, or gallops. Respiratory: Normal respiratory effort without tachypnea nor retractions. Breath sounds are clear and equal bilaterally. No wheezes/rales/rhonchi. Gastrointestinal: Soft and nontender. No distention. There is no CVA tenderness. Genitourinary: deferred Musculoskeletal: Nontender with normal range of  motion in all extremities. No joint effusions.  No lower extremity tenderness nor edema. Neurologic:  Normal speech and language. No gross focal neurologic deficits are appreciated. Speech is normal.  Skin:  Skin is warm, dry and intact. No rash noted. Psychiatric: Mood and affect are normal. Speech and behavior are normal. Patient exhibits appropriate insight and judgment.  ____________________________________________    LABS (pertinent positives/negatives)  Labs Reviewed  COMPREHENSIVE METABOLIC PANEL - Abnormal; Notable for the following:    Total Bilirubin 0.2 (*)    All other components within normal limits  CBC - Abnormal; Notable for the following:    Hemoglobin 11.8 (*)    RDW 15.1 (*)    All other components within normal limits  URINALYSIS COMPLETEWITH MICROSCOPIC ([facility] ONLY) - Abnormal; Notable for the following:    Color, Urine RED (*)    APPearance TURBID (*)    Glucose, UA   (*)    Value: TEST NOT REPORTED DUE TO COLOR INTERFERENCE OF URINE PIGMENT   Bilirubin Urine   (*)    Value: TEST NOT REPORTED DUE TO COLOR INTERFERENCE OF URINE PIGMENT   Ketones, ur   (*)    Value: TEST NOT REPORTED DUE TO COLOR INTERFERENCE OF URINE PIGMENT   Hgb urine dipstick   (*)    Value: TEST NOT REPORTED DUE TO COLOR INTERFERENCE OF URINE PIGMENT   Protein, ur   (*)    Value: TEST NOT REPORTED DUE TO COLOR INTERFERENCE OF URINE PIGMENT   Nitrite   (*)    Value: TEST NOT REPORTED DUE TO COLOR INTERFERENCE OF URINE PIGMENT   Leukocytes, UA   (*)    Value: TEST NOT REPORTED DUE TO COLOR INTERFERENCE OF URINE PIGMENT   All other components within normal limits  HCG, QUANTITATIVE, PREGNANCY - Abnormal; Notable for the following:    hCG, Beta Chain, Sharene Butters, S 20284 (*)    All other components within normal limits  LIPASE, BLOOD     RADIOLOGY      US OB Comp Less 14 Wks (Final result) Result time: 05/17/16 01:35:41   Final result by Rad Results In Interface (05/17/16 01:35:41)    Narrative:   CLINICAL DATA: 31 year old female with pelvic pain and vaginal bleeding  EXAM: OBSTETRIC <14 WK [place] AND TRANSVAGINAL OB US  TECHNIQUE: Both transabdominal and transvaginal ultrasound examinations were performed for complete evaluation of the gestation as well as the maternal uterus, adnexal regions, and pelvic cul-de-sac. Transvaginal technique was performed to assess early pregnancy.  COMPARISON: None for this pregnancy  FINDINGS: The uterus is retroverted and appears heterogeneous. No intrauterine pregnancy identified. The endometrium is thickened measuring 11 mm in thickness. Echogenic endometrial content with no internal vascularity most likely represent blood product. Trace fluid noted in the endocervical canal.  Please note with positive HCG levels and in the absence of documented IUP by ultrasound, the possibility of an ectopic pregnancy  is not entirely excluded. Correlation with clinical exam and follow-up with serial HCG levels and ultrasound recommended.  The right ovary measures 3.6 x 2.0 x 2.3 cm and the left ovary measures 2.6 x 1.8 x 2.1 cm. The ovaries appear unremarkable.  No free fluid identified within the pelvis.  IMPRESSION: No intrauterine pregnancy identified. Correlation with clinical exam and follow-up with serial HCG levels and ultrasound recommended.  Thickened endometrium containing blood product.   Electronically Signed By: Elgie Collard M.D. On: 05/17/2016 01:35          US Ob Transvaginal (Final result) Result time: 05/17/16 01:35:41   Final result by Rad Results In Interface (05/17/16 01:35:41)   Narrative:   CLINICAL DATA: 30 year old female with pelvic pain and vaginal bleeding  EXAM: OBSTETRIC <14 WK [place] AND TRANSVAGINAL OB US  TECHNIQUE: Both transabdominal and transvaginal ultrasound examinations were performed for complete evaluation of the gestation as well as the maternal uterus, adnexal  regions, and pelvic cul-de-sac. Transvaginal technique was performed to assess early pregnancy.  COMPARISON: None for this pregnancy  FINDINGS: The uterus is retroverted and appears heterogeneous. No intrauterine pregnancy identified. The endometrium is thickened measuring 11 mm in thickness. Echogenic endometrial content with no internal vascularity most likely represent blood product. Trace fluid noted in the endocervical canal.  Please note with positive HCG levels and in the absence of documented IUP by ultrasound, the possibility of an ectopic pregnancy is not entirely excluded. Correlation with clinical exam and follow-up with serial HCG levels and ultrasound recommended.  The right ovary measures 3.6 x 2.0 x 2.3 cm and the left ovary measures 2.6 x 1.8 x 2.1 cm. The ovaries appear unremarkable.  No free fluid identified within the pelvis.  IMPRESSION: No intrauterine pregnancy identified. Correlation with clinical exam and follow-up with serial HCG levels and ultrasound recommended.  Thickened endometrium containing blood product.   Electronically Signed By: Elgie Collard M.D. On: 05/17/2016 01:35        Critical Care:CRITICAL CARE Performed by: Darci Current   Total critical care time: 30 minutes  Critical care time was exclusive of separately billable procedures and treating other patients.  Critical care was necessary to treat or prevent imminent or life-threatening deterioration.  Critical care was time spent personally by me on the following activities: development of treatment plan with patient and/or surrogate as well as nursing, discussions with consultants, evaluation of patient's response to treatment, examination of patient, obtaining history from patient or surrogate, ordering and performing treatments and interventions, ordering and review of laboratory studies, ordering and review of radiographic studies, pulse oximetry and re-evaluation  of patient's condition.   Procedures     INITIAL IMPRESSION / ASSESSMENT AND PLAN / ED COURSE  Pertinent labs & imaging results that were available during my care of the patient were reviewed by me and considered in my medical decision making (see chart for details).  Patient discussed with Deatra Robinson nurse practitioner on-call for OB/GYN with concern for possible ectopic pregnancy given hCG level rated and discriminatory zone with no intrauterine pregnancy noted on ultrasound. In addition the fact patient has left-sided unilateral pelvic pain. As such patient discussed with Dr. Dalbert Garnet OB/GYN on call  ____________________________________________   FINAL CLINICAL IMPRESSION(S) / ED DIAGNOSES  Final diagnoses:  Ectopic pregnancy      Darci Current, MD 05/17/16 [id number]

## 2016-05-18 LAB — CBC
HCT: 29.8 % — ABNORMAL LOW (ref 35.0–47.0)
Hemoglobin: 10.2 g/dL — ABNORMAL LOW (ref 12.0–16.0)
MCH: 27.4 pg (ref 26.0–34.0)
MCHC: 34.3 g/dL (ref 32.0–36.0)
MCV: 80 fL (ref 80.0–100.0)
PLATELETS: 198 10*3/uL (ref 150–440)
RBC: 3.72 MIL/uL — AB (ref 3.80–5.20)
RDW: 14.9 % — ABNORMAL HIGH (ref 11.5–14.5)
WBC: 7.5 10*3/uL (ref 3.6–11.0)

## 2016-05-18 MED ORDER — METRONIDAZOLE 500 MG PO TABS: 500.0000 mg | ORAL_TABLET | Freq: Two times a day (BID) | ORAL | Status: AC

## 2016-05-18 MED ORDER — FERROUS SULFATE 325 (65 FE) MG PO TABS: 325.0000 mg | ORAL_TABLET | Freq: Two times a day (BID) | ORAL | Status: DC

## 2016-05-18 MED ORDER — NORGESTIMATE-ETH ESTRADIOL 0.25-35 MG-MCG PO TABS: 1.0000 | ORAL_TABLET | Freq: Every day | ORAL | Status: DC

## 2016-05-18 NOTE — Discharge Instructions (Signed)
Miscarriage  A miscarriage is the sudden loss of an unborn baby (fetus) before the 20th week of pregnancy. Most miscarriages happen in the first 3 months of pregnancy. Sometimes, it happens before a woman even knows she is pregnant. A miscarriage is also called a "spontaneous miscarriage" or "early pregnancy loss." Having a miscarriage can be an emotional experience. Talk with your caregiver about any questions you may have about miscarrying, the grieving process, and your future pregnancy plans.  CAUSES    Problems with the fetal chromosomes that make it impossible for the baby to develop normally. Problems with the baby's genes or chromosomes are most often the result of errors that occur, by chance, as the embryo divides and grows. The problems are not inherited from the parents.   Infection of the cervix or uterus.    Hormone problems.    Problems with the cervix, such as having an incompetent cervix. This is when the tissue in the cervix is not strong enough to hold the pregnancy.    Problems with the uterus, such as an abnormally shaped uterus, uterine fibroids, or congenital abnormalities.    Certain medical conditions.    Smoking, drinking alcohol, or taking illegal drugs.    Trauma.   Often, the cause of a miscarriage is unknown.   SYMPTOMS    Vaginal bleeding or spotting, with or without cramps or pain.   Pain or cramping in the abdomen or lower back.   Passing fluid, tissue, or blood clots from the vagina.  DIAGNOSIS   Your caregiver will perform a physical exam. You may also have an ultrasound to confirm the miscarriage. Blood or urine tests may also be ordered.  TREATMENT    Sometimes, treatment is not necessary if you naturally pass all the fetal tissue that was in the uterus. If some of the fetus or placenta remains in the body (incomplete miscarriage), tissue left behind may become infected and must be removed. Usually, a dilation and curettage (D and C) procedure is performed.  During a D and C procedure, the cervix is widened (dilated) and any remaining fetal or placental tissue is gently removed from the uterus.   Antibiotic medicines are prescribed if there is an infection. Other medicines may be given to reduce the size of the uterus (contract) if there is a lot of bleeding.   If you have Rh negative blood and your baby was Rh positive, you will need a Rh immunoglobulin shot. This shot will protect any future baby from having Rh blood problems in future pregnancies.  HOME CARE INSTRUCTIONS    Your caregiver may order bed rest or may allow you to continue light activity. Resume activity as directed by your caregiver.   Have someone help with home and family responsibilities during this time.    Keep track of the number of sanitary pads you use each day and how soaked (saturated) they are. Write down this information.    Do not use tampons. Do not douche or have sexual intercourse until approved by your caregiver.    Only take over-the-counter or prescription medicines for pain or discomfort as directed by your caregiver.    Do not take aspirin. Aspirin can cause bleeding.    Keep all follow-up appointments with your caregiver.    If you or your partner have problems with grieving, talk to your caregiver or seek counseling to help cope with the pregnancy loss. Allow enough time to grieve before trying to get pregnant again.     SEEK IMMEDIATE MEDICAL CARE IF:    You have severe cramps or pain in your back or abdomen.   You have a fever.   You pass large blood clots (walnut-sized or larger) ortissue from your vagina. Save any tissue for your caregiver to inspect.    Your bleeding increases.    You have a thick, bad-smelling vaginal discharge.   You become lightheaded, weak, or you faint.    You have chills.   MAKE SURE YOU:   Understand these instructions.   Will watch your condition.   Will get help right away if you are not doing well or get worse.     This  information is not intended to replace advice given to you by your health care provider. Make sure you discuss any questions you have with your health care provider.     Document Released: 04/18/2001 Document Revised: 02/17/2013 Document Reviewed: 12/12/2011  Elsevier Interactive Patient Education 2016 [employer]

## 2016-05-18 NOTE — Discharge Summary (Addendum)
Physician Discharge Summary  Patient ID: Jordan [name] MRN: [id number] DOB/AGE: [birth year] 30 y.o.  Admit date: 05/17/2016 Discharge date: 05/18/2016  Admission Diagnoses:  Discharge Diagnoses:  Active Problems:   Left lateral abdominal pain   Discharged Condition: good  Hospital Course: Admitted for observation for suspected incomplete AB with significant bleeding to r/o ectopic pregnancy. Imaging neg for fluid in pelvis and adnexal mass. Admission beta quant was 20,000 which had dropped to 10k 12 hours later. A repeat after 24hrs is pending.  Hgb- starting 11.8 and on discharge hgb 11.10.2. Pt is asx. Hypotension- pt states is normal for her. Orthostatics wnl. O2 sat 100%. Asx. Is/Os appropriate  After observation overnight, she was discharged home in good condition with repeat beta quant in 1 week and f/u in office in 1 week. Ectopic precautions emphasized.  Consults: None  Significant Diagnostic Studies: see above  Treatments: IV hydration and serial labs and vitals  Discharge Exam: Blood pressure 85/48, pulse 54, temperature 98 F (36.7 C), temperature source Oral, resp. rate 18, height  (1.626 m), weight 110 lb (49.896 kg), last menstrual period 05/08/2016, SpO2 100 %, unknown if currently breastfeeding. General appearance: alert, cooperative, appears stated age and no distress Resp: clear to auscultation bilaterally Cardio: regular rate and rhythm, S1, S2 normal, no murmur, click, rub or gallop GI: soft, non-tender; bowel sounds normal; no masses,  no organomegaly Pelvic: adnexae not palpable, external genitalia normal, no adnexal masses or tenderness, no cervical motion tenderness, uterus normal size, shape, and consistency and minimal vaginal spotting on glove Extremities: extremities normal, atraumatic, no cyanosis or edema and Homans sign is negative, no sign of DVT Pulses: 2+ and symmetric Skin: Skin color, texture, turgor normal. No rashes or  lesions Neurologic: Grossly normal  Disposition: 01-Home or Self Care     Medication List    TAKE these medications        ferrous sulfate 325 (65 FE) MG tablet  Commonly known as:  FERROUSUL  Take 1 tablet (325 mg total) by mouth 2 (two) times daily.     metroNIDAZOLE 500 MG tablet  Commonly known as:  FLAGYL  Take 1 tablet (500 mg total) by mouth every 12 (twelve) hours.     norgestimate-ethinyl estradiol 0.25-35 MG-MCG tablet  Commonly known as:  ORTHO-CYCLEN,SPRINTEC,PREVIFEM  Take 1 tablet by mouth daily.       Follow-up Information    Follow up with Christeen Douglas, MD In 1 week.   Specialty:  Obstetrics and Gynecology   Why:  followup miscarriage   Contact information:   [address] [place] [address] [phone]      30yo [id number] at unknown LMP with a decreasing beta hcg, now no pelvic or abdominal pain, and minimal vaginal bleeding.   1. DDx includes incomplete abortion, non-ruptured ectopic pregnancy, constipation. The evidence points to SAB, with rapidly dropping beta, minimal abdoinal tenderness and no peritoneal signs, with ultrasound neg for free fluid or adnexal masses, and with her current stable vitals and HR. - repeat beta hcg pending - repeat CBC stable   2. Hypotension: Pt is asx. Orthostatics normal  3. Calf tenderness: resolved  4. Constipation: May explain tenderness in left pelvis more than uterine tenderness on exam. Recommendations ([email or web address] and bowel diet) discussed.   5. Undesired/unplanning pregnancy- Contraception: Options discussed. Vasectomy preferred by patient and boyfriend. Depo declined. OCP script home with patient  6. DVT ppx- early and frequent ambulation  7. Vaginal infection: Bacterial vaginosis dx by  [facility] last night. Flagyl x7 days given.  8. Acute blood loss anemia: Iron prescribed  9. Discharge in good condition  SignedChristeen Douglas: Treyshon Buchanon 05/18/2016, 8:38 AM

## 2016-05-19 LAB — BETA HCG QUANT (REF LAB): BETA HCG, TUMOR MARKER: 5232 m[IU]/mL

## 2016-06-16 ENCOUNTER — Emergency Department
Admission: EM | Admit: 2016-06-16 | Discharge: 2016-06-16 | Disposition: A | Discharge status: Home / Self Care | Payer: Self-pay | Attending: Student | Admitting: Student

## 2016-06-16 ENCOUNTER — Encounter: Payer: Self-pay | Admitting: Emergency Medicine

## 2016-06-16 ENCOUNTER — Emergency Department: Payer: Self-pay

## 2016-06-16 DIAGNOSIS — R102 Pelvic and perineal pain: Secondary | ICD-10-CM | POA: Insufficient documentation

## 2016-06-16 DIAGNOSIS — F17210 Nicotine dependence, cigarettes, uncomplicated: Secondary | ICD-10-CM | POA: Insufficient documentation

## 2016-06-16 DIAGNOSIS — IMO0002 Reserved for concepts with insufficient information to code with codable children: Secondary | ICD-10-CM

## 2016-06-16 DIAGNOSIS — R52 Pain, unspecified: Secondary | ICD-10-CM

## 2016-06-16 DIAGNOSIS — J45909 Unspecified asthma, uncomplicated: Secondary | ICD-10-CM | POA: Insufficient documentation

## 2016-06-16 DIAGNOSIS — R1032 Left lower quadrant pain: Secondary | ICD-10-CM

## 2016-06-16 LAB — CBC
HCT: 38.6 % (ref 35.0–47.0)
Hemoglobin: 12.9 g/dL (ref 12.0–16.0)
MCH: 27.6 pg (ref 26.0–34.0)
MCHC: 33.5 g/dL (ref 32.0–36.0)
MCV: 82.3 fL (ref 80.0–100.0)
PLATELETS: 244 10*3/uL (ref 150–440)
RBC: 4.69 MIL/uL (ref 3.80–5.20)
RDW: 14.3 % (ref 11.5–14.5)
WBC: 6.8 10*3/uL (ref 3.6–11.0)

## 2016-06-16 LAB — COMPREHENSIVE METABOLIC PANEL
ALBUMIN: 4.6 g/dL (ref 3.5–5.0)
ALK PHOS: 58 U/L (ref 38–126)
ALT: 30 U/L (ref 14–54)
AST: 30 U/L (ref 15–41)
Anion gap: 6 (ref 5–15)
BUN: 7 mg/dL (ref 6–20)
CALCIUM: 9.6 mg/dL (ref 8.9–10.3)
CO2: 25 mmol/L (ref 22–32)
CREATININE: 0.57 mg/dL (ref 0.44–1.00)
Chloride: 106 mmol/L (ref 101–111)
GFR calc non Af Amer: >60 mL/min (ref >60)
GLUCOSE: 97 mg/dL (ref 65–99)
Potassium: 3.5 mmol/L (ref 3.5–5.1)
SODIUM: 137 mmol/L (ref 135–145)
Total Bilirubin: 0.5 mg/dL (ref 0.3–1.2)
Total Protein: 7.9 g/dL (ref 6.5–8.1)

## 2016-06-16 LAB — URINALYSIS COMPLETE WITH MICROSCOPIC (ARMC ONLY)
BILIRUBIN URINE: NEGATIVE
Bacteria, UA: NONE SEEN
Glucose, UA: NEGATIVE mg/dL
Hgb urine dipstick: NEGATIVE
Ketones, ur: NEGATIVE mg/dL
Leukocytes, UA: NEGATIVE
Nitrite: NEGATIVE
PH: 6 (ref 5.0–8.0)
PROTEIN: NEGATIVE mg/dL
Specific Gravity, Urine: 1.023 (ref 1.005–1.030)

## 2016-06-16 LAB — LIPASE, BLOOD: Lipase: 21 U/L (ref 11–51)

## 2016-06-16 LAB — CHLAMYDIA/NGC RT PCR (ARMC ONLY)
Chlamydia Tr: NOT DETECTED
N GONORRHOEAE: NOT DETECTED

## 2016-06-16 LAB — HCG, QUANTITATIVE, PREGNANCY: HCG, BETA CHAIN, QUANT, S: 2673 m[IU]/mL — AB (ref <5)

## 2016-06-16 LAB — WET PREP, GENITAL
Clue Cells Wet Prep HPF POC: NONE SEEN
Sperm: NONE SEEN
Trich, Wet Prep: NONE SEEN
WBC WET PREP: NONE SEEN
YEAST WET PREP: NONE SEEN

## 2016-06-16 LAB — POCT PREGNANCY, URINE: Preg Test, Ur: POSITIVE — AB

## 2016-06-16 NOTE — ED Triage Notes (Addendum)
Pt reports lower abdominal pain that radiates into her back since last night; denies vomiting or diarrhea. Pt reports pain with urination but pt reports she has IC. Pt also with cyst on left ovary.

## 2016-06-16 NOTE — Discharge Instructions (Signed)
You were seen in the emergency department for abdominal pain in your ultrasound shows that there may be some products of conception/pregnancy products still in your uterus. Dr. Dalbert GarnetBeasley will perform a D&C at 7:30 AM on Monday morning, please follow up for this procedure as we discussed. Return immediately to the emergency department if you develop severe or worsening abdominal pain, any vaginal bleeding, fevers, vomiting, diarrhea, chest pain or difficulty breathing or for any other concerns.

## 2016-06-16 NOTE — H&P (Signed)
SERVICE: Gynecology   Patient Name: Jordan [name] Patient MRN:   [id number]  CC: Abdominal pain  HPI: Jordan [name] is a 30 y.o.  [id number] with unknown LMP but hx of C/S x3, suction D&C x2 and 1 SAB which has NOT completed yet presents to the ER with no vaginal bleeding but abdominal pain like period cramps.  Ultrasound with possible retained POC. Hypervascular area in fundus, with cystic areas, possibly rPOC vs partial molar pregnancy. Much less likely AVM. Normal ovaries.  Has been unable to f/u with beta quants because she has no medical insurance.  Today, beta is 2000.  Last beta 7/13 was 5232.  Prior beta 05/17/16 10,826.   Review of Systems: positives in bold GEN:              fevers, chills, weight changes, appetite changes, fatigue, night sweats HEENT:  HA, vision changes, hearing loss, congestion, rhinorrhea, sinus pressure, dysphagia CV:                 CP, palpitations PULM:  SOB, cough GI:                 abd pain, N/V/D/C GU:                dysuria, urgency, frequency MSK:             arthralgias, myalgias, back pain, swelling SKIN:             rashes, color changes, pallor NEURO:  numbness, weakness, tingling, seizures, dizziness, tremors PSYCH:  depression, anxiety, behavioral problems, confusion  HEME/LYMPH:  easy bruising or bleeding ENDO:  heat/cold intolerance  Past Obstetrical History:         OB History    Gravida Para Term Preterm AB Living   SAB TAB Ectopic Multiple Live Births         0 2      Past Gynecologic History: Patient's last menstrual period was 05/08/2016 (lmp unknown).   Past Medical History:     Past Medical History:  Diagnosis Date  . Anemia   . Anxiety   . Asthma    childhood asthma  . Headache   . Heart murmur 30 years old  . Interstitial cystitis   . Interstitial cystitis 30 years old  . Placenta previa   . Restless leg syndrome     Past Surgical History:        Past  Surgical History:  Procedure Laterality Date  . CESAREAN SECTION x3 total  2011/2013  . CESAREAN SECTION  Bilateral 02/14/2016   Procedure: CESAREAN SECTION;  Surgeon: Suzy Bouchard, MD;  Location: [facility] ORS;  Service: Obstetrics;  Laterality: Bilateral;  . DILATION AND CURETTAGE OF UTERUS x2      Family History:  family history is not on file.  Social History:  Social History        Social History  . Marital status: Single    Spouse name: N/A  . Number of children: N/A  . Years of education: N/A      Occupational History  . Not on file.        Social History Main Topics  . Smoking status: Current Every Day Smoker    Packs/day: 1.00    Types: Cigarettes  . Smokeless tobacco: Never Used  . Alcohol use No  . Drug use: No  . Sexual activity: Yes  Other Topics Concern  . Not on file      Social History Narrative  . No narrative on file    Home Medications:  Medications reconciled in EPIC  No current facility-administered medications on file prior to encounter.          Current Outpatient Prescriptions on File Prior to Encounter  Medication Sig Dispense Refill  . ferrous sulfate (FERROUSUL) 325 (65 FE) MG tablet Take 1 tablet (325 mg total) by mouth 2 (two) times daily. 60 tablet 1  . norgestimate-ethinyl estradiol (ORTHO-CYCLEN,SPRINTEC,PREVIFEM) 0.25-35 MG-MCG tablet Take 1 tablet by mouth daily. 3 Package 4    Allergies:       Allergies  Allergen Reactions  . Vicodin [Hydrocodone-Acetaminophen] Swelling  . Amoxicillin Swelling and Rash    Reaction unknown    Physical Exam:  Temp:  [98.3 F (36.8 C)] 98.3 F (36.8 C) (08/11 1353) Pulse Rate:  [70] 70 (08/11 1353) Resp:  [18] 18 (08/11 1353) BP: (106)/(59) 106/59 (08/11 1353) SpO2:  [99 %] 99 % (08/11 1353) Weight:  [110 lb (49.9 kg)] 110 lb (49.9 kg) (08/11 1353)   General Appearance:  Well developed, well nourished, no acute distress, alert and oriented  x3 HEENT:  Normocephalic atraumatic, extraocular movements intact, moist mucous membranes Cardiovascular:  Normal S1/S2, regular rate and rhythm, no murmurs Pulmonary:  clear to auscultation, no wheezes, rales or rhonchi, symmetric air entry, good air exchange Abdomen:  Bowel sounds present, soft, nontender, nondistended, no abnormal masses, no epigastric pain Extremities:  Full range of motion, no pedal edema, 2+ distal pulses, no tenderness Skin:  normal coloration and turgor, no rashes, no suspicious skin lesions noted  Neurologic:  Cranial nerves 2-12 grossly intact, normal muscle tone, strength 5/5 all four extremities Psychiatric:  Normal mood and affect, appropriate, no AH/VH Pelvic:  NEFG, no vulvar masses or lesions, normal vaginal mucosa, no vaginal bleeding. cervix without lesions or erythema, mildly tender uterus, no adnexal masses appreciated, no palpable nodularity on rectovaginal exam, no pelvic organ prolapse. +vaginal discharge.   Labs/Studies:   CBC and Coags:                  Recent Labs       Lab Results  Component Value Date   WBC 6.8 06/16/2016   NEUTOPHILPCT 57 09/13/2015   EOSPCT 2 09/13/2015   BASOPCT 0 09/13/2015   LYMPHOPCT 32 09/13/2015   HGB 12.9 06/16/2016   HCT 38.6 06/16/2016   MCV 82.3 06/16/2016   PLT 244 06/16/2016     CMP:              Recent Labs       Lab Results  Component Value Date   NA 137 06/16/2016   K 3.5 06/16/2016   CL 106 06/16/2016   CO2 25 06/16/2016   BUN 7 06/16/2016   CREATININE 0.57 06/16/2016   CREATININE 0.66 05/17/2016   CREATININE 0.71 05/16/2016   PROT 7.9 06/16/2016   BILITOT 0.5 06/16/2016   ALT 30 06/16/2016   AST 30 06/16/2016   ALKPHOS 58 06/16/2016      Other Imaging:  Imaging Results  [place] Ob Comp Less 14 Wks  Result Date: 06/16/2016 CLINICAL DATA:  30 year old pregnant female with 1 day of diffuse pelvic pain. Quantitative beta HCG 2,673. Uncertain LMP. EXAM: OBSTETRIC  <14 WK US AND TRANSVAGINAL OB US DOPPLER ULTRASOUND OF OVARIES TECHNIQUE: Both transabdominal and transvaginal ultrasound examinations were performed for complete evaluation of the gestation as  well as the maternal uterus, adnexal regions, and pelvic cul-de-sac. Transvaginal technique was performed to assess early pregnancy. Color and duplex Doppler ultrasound was utilized to evaluate blood flow to the ovaries. COMPARISON:  05/17/2016 obstetric scan. FINDINGS: The uterus is retroverted and retroflexed and measures 7.9 x 4.3 x 5.2 cm in size. No uterine fibroids or other myometrial abnormalities. There is no intrauterine gestational sac. The endometrium is thickened (15 mm) and heterogeneous, with the suggestion of a mixed echogenicity 2.2 x 1.2 x 1.9 cm mass in the fundal endometrium demonstrating internal hypervascularity on color Doppler and tiny internal cystic components. Right ovary measures 2.8 x 2.8 x 2.9 cm and contains a simple 2.4 cm cyst. Left ovary measures 3.0 x 2.3 x 1.9 cm and contains a dominant simple 2.1 cm cyst. No suspicious ovarian or adnexal masses. No abnormal free fluid in the pelvis. Pulsed Doppler evaluation of both ovaries demonstrates normal appearing low-resistance arterial and venous waveforms. IMPRESSION: 1. No intrauterine gestational sac. Thickened (15 mm) and heterogeneous endometrium, with the suggestion of a mixed echogenicity hypervascular 2.2 cm mass in the fundal endometrium. Given the beta HCG level greater than 2000, the decline of the beta HCG level since one month prior and the absence of suspicious adnexal masses, the most likely diagnosis is a spontaneous abortion with retained products of conception. The possibility of a molar pregnancy cannot be excluded given the tiny cystic components within the endometrial mass. OB/GYN consultation advised. Hysteroscopy / D&C should be considered. 2. Normal ovaries.  No evidence of adnexal torsion. Electronically Signed   By: Delbert Phenix M.D.   On: 06/16/2016 17:00   US Ob Transvaginal  Result Date: 06/16/2016 CLINICAL DATA:  30 year old pregnant female with 1 day of diffuse pelvic pain. Quantitative beta HCG 2,673. Uncertain LMP. EXAM: OBSTETRIC <14 WK [place] AND TRANSVAGINAL OB US DOPPLER ULTRASOUND OF OVARIES TECHNIQUE: Both transabdominal and transvaginal ultrasound examinations were performed for complete evaluation of the gestation as well as the maternal uterus, adnexal regions, and pelvic cul-de-sac. Transvaginal technique was performed to assess early pregnancy. Color and duplex Doppler ultrasound was utilized to evaluate blood flow to the ovaries. COMPARISON:  05/17/2016 obstetric scan. FINDINGS: The uterus is retroverted and retroflexed and measures 7.9 x 4.3 x 5.2 cm in size. No uterine fibroids or other myometrial abnormalities. There is no intrauterine gestational sac. The endometrium is thickened (15 mm) and heterogeneous, with the suggestion of a mixed echogenicity 2.2 x 1.2 x 1.9 cm mass in the fundal endometrium demonstrating internal hypervascularity on color Doppler and tiny internal cystic components. Right ovary measures 2.8 x 2.8 x 2.9 cm and contains a simple 2.4 cm cyst. Left ovary measures 3.0 x 2.3 x 1.9 cm and contains a dominant simple 2.1 cm cyst. No suspicious ovarian or adnexal masses. No abnormal free fluid in the pelvis. Pulsed Doppler evaluation of both ovaries demonstrates normal appearing low-resistance arterial and venous waveforms. IMPRESSION: 1. No intrauterine gestational sac. Thickened (15 mm) and heterogeneous endometrium, with the suggestion of a mixed echogenicity hypervascular 2.2 cm mass in the fundal endometrium. Given the beta HCG level greater than 2000, the decline of the beta HCG level since one month prior and the absence of suspicious adnexal masses, the most likely diagnosis is a spontaneous abortion with retained products of conception. The possibility of a molar pregnancy cannot be  excluded given the tiny cystic components within the endometrial mass. OB/GYN consultation advised. Hysteroscopy / D&C should be considered. 2. Normal ovaries.  No evidence of adnexal torsion. Electronically Signed   By: Delbert Phenix M.D.   On: 06/16/2016 17:00   [place] Art/ven Flow Abd Pelv Doppler  Result Date: 06/16/2016 CLINICAL DATA:  30 year old pregnant female with 1 day of diffuse pelvic pain. Quantitative beta HCG 2,673. Uncertain LMP. EXAM: OBSTETRIC <14 WK [place] AND TRANSVAGINAL OB US DOPPLER ULTRASOUND OF OVARIES TECHNIQUE: Both transabdominal and transvaginal ultrasound examinations were performed for complete evaluation of the gestation as well as the maternal uterus, adnexal regions, and pelvic cul-de-sac. Transvaginal technique was performed to assess early pregnancy. Color and duplex Doppler ultrasound was utilized to evaluate blood flow to the ovaries. COMPARISON:  05/17/2016 obstetric scan. FINDINGS: The uterus is retroverted and retroflexed and measures 7.9 x 4.3 x 5.2 cm in size. No uterine fibroids or other myometrial abnormalities. There is no intrauterine gestational sac. The endometrium is thickened (15 mm) and heterogeneous, with the suggestion of a mixed echogenicity 2.2 x 1.2 x 1.9 cm mass in the fundal endometrium demonstrating internal hypervascularity on color Doppler and tiny internal cystic components. Right ovary measures 2.8 x 2.8 x 2.9 cm and contains a simple 2.4 cm cyst. Left ovary measures 3.0 x 2.3 x 1.9 cm and contains a dominant simple 2.1 cm cyst. No suspicious ovarian or adnexal masses. No abnormal free fluid in the pelvis. Pulsed Doppler evaluation of both ovaries demonstrates normal appearing low-resistance arterial and venous waveforms. IMPRESSION: 1. No intrauterine gestational sac. Thickened (15 mm) and heterogeneous endometrium, with the suggestion of a mixed echogenicity hypervascular 2.2 cm mass in the fundal endometrium. Given the beta HCG level greater than 2000,  the decline of the beta HCG level since one month prior and the absence of suspicious adnexal masses, the most likely diagnosis is a spontaneous abortion with retained products of conception. The possibility of a molar pregnancy cannot be excluded given the tiny cystic components within the endometrial mass. OB/GYN consultation advised. Hysteroscopy / D&C should be considered. 2. Normal ovaries.  No evidence of adnexal torsion. Electronically Signed   By: Delbert Phenix M.D.   On: 06/16/2016 17:00      Assessment / Plan:   QUANA CHAMBERLAIN Bochicchio is a 30 y.o. [id number] who presents with abdominal pain and is found to have retained POC vs partial molar pregnancy, with known incomplete AB.  2. Possible options discussed with the patient. At this time she is interested in suction D&C to remove this hypervascular area/retained products of conception. She is also requesting permanent sterilization. She's had this desire in the past and expressively wishes it. She does not have insurance, and I have discussed with the hospital ways to get this procedure completed. Because She is stable, I have added her on for Monday morning at 7 AM to complete her D&C. I have given her strict bleeding precautions, including increasing pain and heavy vaginal bleeding, or signs or symptoms of systemic infection. She will call our office or return to the emergency room if she does have these concerns.  I discussed the risks and benefits of this procedure including bleeding, infection and damage to surrounding organs. She is aware that if excessive bleeding were to occur, she might need an hysterectomy. She is amenable to this procedure and it will be completed in the next 3 days. Consents for this procedure were not signed with the patient in the emergency room because we did not have access to that paper records. She was, however, fully consented.

## 2016-06-16 NOTE — Consult Note (Signed)
Consult History and Physical   SERVICE: Gynecology   Patient Name: Jordan [name] Patient MRN:   [id number]  CC: Abdominal pain  HPI: Jordan [name] is a 30 y.o.  [id number] with unknown LMP but hx of C/S x3, suction D&C x2 and 1 SAB which has NOT completed yet presents to the ER with no vaginal bleeding but abdominal pain like period cramps.  Ultrasound with possible retained POC. Hypervascular area in fundus, with cystic areas, possibly rPOC vs partial molar pregnancy. Much less likely AVM. Normal ovaries.  Has been unable to f/u with beta quants because she has no medical insurance.  Today, beta is 2000.  Last beta 7/13 was 5232.  Prior beta 05/17/16 10,826.   Review of Systems: positives in bold GEN:   fevers, chills, weight changes, appetite changes, fatigue, night sweats HEENT:  HA, vision changes, hearing loss, congestion, rhinorrhea, sinus pressure, dysphagia CV:   CP, palpitations PULM:  SOB, cough GI:  abd pain, N/V/D/C GU:  dysuria, urgency, frequency MSK:  arthralgias, myalgias, back pain, swelling SKIN:  rashes, color changes, pallor NEURO:  numbness, weakness, tingling, seizures, dizziness, tremors PSYCH:  depression, anxiety, behavioral problems, confusion  HEME/LYMPH:  easy bruising or bleeding ENDO:  heat/cold intolerance  Past Obstetrical History: OB History    Gravida Para Term Preterm AB Living   5 3 3   2 3    SAB TAB Ectopic Multiple Live Births         0 2      Past Gynecologic History: Patient's last menstrual period was 05/08/2016 (lmp unknown).   Past Medical History: Past Medical History:  Diagnosis Date  . Anemia   . Anxiety   . Asthma    childhood asthma  . Headache   . Heart murmur 30 years old  . Interstitial cystitis   . Interstitial cystitis 30 years old  . Placenta previa   . Restless leg syndrome     Past Surgical History:   Past Surgical History:  Procedure Laterality Date  . CESAREAN SECTION  2011/2013  . CESAREAN  SECTION WITH BILATERAL TUBAL LIGATION Bilateral 02/14/2016   Procedure: CESAREAN SECTION;  Surgeon: Suzy Bouchard, MD;  Location: [facility] ORS;  Service: Obstetrics;  Laterality: Bilateral;  . DILATION AND CURETTAGE OF UTERUS      Family History:  family history is not on file.  Social History:  Social History   Social History  . Marital status: Single    Spouse name: N/A  . Number of children: N/A  . Years of education: N/A   Occupational History  . Not on file.   Social History Main Topics  . Smoking status: Current Every Day Smoker    Packs/day: 1.00    Types: Cigarettes  . Smokeless tobacco: Never Used  . Alcohol use No  . Drug use: No  . Sexual activity: Yes   Other Topics Concern  . Not on file   Social History Narrative  . No narrative on file    Home Medications:  Medications reconciled in EPIC  No current facility-administered medications on file prior to encounter.    Current Outpatient Prescriptions on File Prior to Encounter  Medication Sig Dispense Refill  . ferrous sulfate (FERROUSUL) 325 (65 FE) MG tablet Take 1 tablet (325 mg total) by mouth 2 (two) times daily. 60 tablet 1  . norgestimate-ethinyl estradiol (ORTHO-CYCLEN,SPRINTEC,PREVIFEM) 0.25-35 MG-MCG tablet Take 1 tablet by mouth daily. 3 Package 4    Allergies:  Allergies  Allergen Reactions  . Vicodin [Hydrocodone-Acetaminophen] Swelling  . Amoxicillin Swelling and Rash    Reaction unknown    Physical Exam:  Temp:  [98.3 F (36.8 C)] 98.3 F (36.8 C) (08/11 1353) Pulse Rate:  [70] 70 (08/11 1353) Resp:  [18] 18 (08/11 1353) BP: (106)/(59) 106/59 (08/11 1353) SpO2:  [99 %] 99 % (08/11 1353) Weight:  [110 lb (49.9 kg)] 110 lb (49.9 kg) (08/11 1353)   General Appearance:  Well developed, well nourished, no acute distress, alert and oriented x3 HEENT:  Normocephalic atraumatic, extraocular movements intact, moist mucous membranes Cardiovascular:  Normal S1/S2, regular rate and  rhythm, no murmurs Pulmonary:  clear to auscultation, no wheezes, rales or rhonchi, symmetric air entry, good air exchange Abdomen:  Bowel sounds present, soft, nontender, nondistended, no abnormal masses, no epigastric pain Extremities:  Full range of motion, no pedal edema, 2+ distal pulses, no tenderness Skin:  normal coloration and turgor, no rashes, no suspicious skin lesions noted  Neurologic:  Cranial nerves 2-12 grossly intact, normal muscle tone, strength 5/5 all four extremities Psychiatric:  Normal mood and affect, appropriate, no AH/VH Pelvic:  NEFG, no vulvar masses or lesions, normal vaginal mucosa, no vaginal bleeding. cervix without lesions or erythema, mildly tender uterus, no adnexal masses appreciated, no palpable nodularity on rectovaginal exam, no pelvic organ prolapse. +vaginal discharge.   Labs/Studies:   CBC and Coags:  Lab Results  Component Value Date   WBC 6.8 06/16/2016   NEUTOPHILPCT 57 09/13/2015   EOSPCT 2 09/13/2015   BASOPCT 0 09/13/2015   LYMPHOPCT 32 09/13/2015   HGB 12.9 06/16/2016   HCT 38.6 06/16/2016   MCV 82.3 06/16/2016   PLT 244 06/16/2016   CMP:  Lab Results  Component Value Date   NA 137 06/16/2016   K 3.5 06/16/2016   CL 106 06/16/2016   CO2 25 06/16/2016   BUN 7 06/16/2016   CREATININE 0.57 06/16/2016   CREATININE 0.66 05/17/2016   CREATININE 0.71 05/16/2016   PROT 7.9 06/16/2016   BILITOT 0.5 06/16/2016   ALT 30 06/16/2016   AST 30 06/16/2016   ALKPHOS 58 06/16/2016    Other Imaging: [place] Ob Comp Less 14 Wks  Result Date: 06/16/2016 CLINICAL DATA:  30 year old pregnant female with 1 day of diffuse pelvic pain. Quantitative beta HCG 2,673. Uncertain LMP. EXAM: OBSTETRIC <14 WK US AND TRANSVAGINAL OB US DOPPLER ULTRASOUND OF OVARIES TECHNIQUE: Both transabdominal and transvaginal ultrasound examinations were performed for complete evaluation of the gestation as well as the maternal uterus, adnexal regions, and pelvic cul-de-sac.  Transvaginal technique was performed to assess early pregnancy. Color and duplex Doppler ultrasound was utilized to evaluate blood flow to the ovaries. COMPARISON:  05/17/2016 obstetric scan. FINDINGS: The uterus is retroverted and retroflexed and measures 7.9 x 4.3 x 5.2 cm in size. No uterine fibroids or other myometrial abnormalities. There is no intrauterine gestational sac. The endometrium is thickened (15 mm) and heterogeneous, with the suggestion of a mixed echogenicity 2.2 x 1.2 x 1.9 cm mass in the fundal endometrium demonstrating internal hypervascularity on color Doppler and tiny internal cystic components. Right ovary measures 2.8 x 2.8 x 2.9 cm and contains a simple 2.4 cm cyst. Left ovary measures 3.0 x 2.3 x 1.9 cm and contains a dominant simple 2.1 cm cyst. No suspicious ovarian or adnexal masses. No abnormal free fluid in the pelvis. Pulsed Doppler evaluation of both ovaries demonstrates normal appearing low-resistance arterial and venous waveforms. IMPRESSION: 1. No intrauterine gestational sac.  Thickened (15 mm) and heterogeneous endometrium, with the suggestion of a mixed echogenicity hypervascular 2.2 cm mass in the fundal endometrium. Given the beta HCG level greater than 2000, the decline of the beta HCG level since one month prior and the absence of suspicious adnexal masses, the most likely diagnosis is a spontaneous abortion with retained products of conception. The possibility of a molar pregnancy cannot be excluded given the tiny cystic components within the endometrial mass. OB/GYN consultation advised. Hysteroscopy / D&C should be considered. 2. Normal ovaries.  No evidence of adnexal torsion. Electronically Signed   By: Delbert Phenix M.D.   On: 06/16/2016 17:00   US Ob Transvaginal  Result Date: 06/16/2016 CLINICAL DATA:  30 year old pregnant female with 1 day of diffuse pelvic pain. Quantitative beta HCG 2,673. Uncertain LMP. EXAM: OBSTETRIC <14 WK [place] AND TRANSVAGINAL OB US DOPPLER  ULTRASOUND OF OVARIES TECHNIQUE: Both transabdominal and transvaginal ultrasound examinations were performed for complete evaluation of the gestation as well as the maternal uterus, adnexal regions, and pelvic cul-de-sac. Transvaginal technique was performed to assess early pregnancy. Color and duplex Doppler ultrasound was utilized to evaluate blood flow to the ovaries. COMPARISON:  05/17/2016 obstetric scan. FINDINGS: The uterus is retroverted and retroflexed and measures 7.9 x 4.3 x 5.2 cm in size. No uterine fibroids or other myometrial abnormalities. There is no intrauterine gestational sac. The endometrium is thickened (15 mm) and heterogeneous, with the suggestion of a mixed echogenicity 2.2 x 1.2 x 1.9 cm mass in the fundal endometrium demonstrating internal hypervascularity on color Doppler and tiny internal cystic components. Right ovary measures 2.8 x 2.8 x 2.9 cm and contains a simple 2.4 cm cyst. Left ovary measures 3.0 x 2.3 x 1.9 cm and contains a dominant simple 2.1 cm cyst. No suspicious ovarian or adnexal masses. No abnormal free fluid in the pelvis. Pulsed Doppler evaluation of both ovaries demonstrates normal appearing low-resistance arterial and venous waveforms. IMPRESSION: 1. No intrauterine gestational sac. Thickened (15 mm) and heterogeneous endometrium, with the suggestion of a mixed echogenicity hypervascular 2.2 cm mass in the fundal endometrium. Given the beta HCG level greater than 2000, the decline of the beta HCG level since one month prior and the absence of suspicious adnexal masses, the most likely diagnosis is a spontaneous abortion with retained products of conception. The possibility of a molar pregnancy cannot be excluded given the tiny cystic components within the endometrial mass. OB/GYN consultation advised. Hysteroscopy / D&C should be considered. 2. Normal ovaries.  No evidence of adnexal torsion. Electronically Signed   By: Delbert Phenix M.D.   On: 06/16/2016 17:00   [place]  Art/ven Flow Abd Pelv Doppler  Result Date: 06/16/2016 CLINICAL DATA:  30 year old pregnant female with 1 day of diffuse pelvic pain. Quantitative beta HCG 2,673. Uncertain LMP. EXAM: OBSTETRIC <14 WK [place] AND TRANSVAGINAL OB US DOPPLER ULTRASOUND OF OVARIES TECHNIQUE: Both transabdominal and transvaginal ultrasound examinations were performed for complete evaluation of the gestation as well as the maternal uterus, adnexal regions, and pelvic cul-de-sac. Transvaginal technique was performed to assess early pregnancy. Color and duplex Doppler ultrasound was utilized to evaluate blood flow to the ovaries. COMPARISON:  05/17/2016 obstetric scan. FINDINGS: The uterus is retroverted and retroflexed and measures 7.9 x 4.3 x 5.2 cm in size. No uterine fibroids or other myometrial abnormalities. There is no intrauterine gestational sac. The endometrium is thickened (15 mm) and heterogeneous, with the suggestion of a mixed echogenicity 2.2 x 1.2 x 1.9 cm mass in the  fundal endometrium demonstrating internal hypervascularity on color Doppler and tiny internal cystic components. Right ovary measures 2.8 x 2.8 x 2.9 cm and contains a simple 2.4 cm cyst. Left ovary measures 3.0 x 2.3 x 1.9 cm and contains a dominant simple 2.1 cm cyst. No suspicious ovarian or adnexal masses. No abnormal free fluid in the pelvis. Pulsed Doppler evaluation of both ovaries demonstrates normal appearing low-resistance arterial and venous waveforms. IMPRESSION: 1. No intrauterine gestational sac. Thickened (15 mm) and heterogeneous endometrium, with the suggestion of a mixed echogenicity hypervascular 2.2 cm mass in the fundal endometrium. Given the beta HCG level greater than 2000, the decline of the beta HCG level since one month prior and the absence of suspicious adnexal masses, the most likely diagnosis is a spontaneous abortion with retained products of conception. The possibility of a molar pregnancy cannot be excluded given the tiny cystic  components within the endometrial mass. OB/GYN consultation advised. Hysteroscopy / D&C should be considered. 2. Normal ovaries.  No evidence of adnexal torsion. Electronically Signed   By: Delbert Phenix M.D.   On: 06/16/2016 17:00     Assessment / Plan:   Jordan [name] is a 30 y.o. [id number] who presents with abdominal pain and is found to have retained POC vs partial molar pregnancy, with known incomplete AB.  1. Wet mount ordered 2. Possible options discussed with the patient. At this time she is interested in suction D&C to remove this hypervascular area/retained products of conception. She is also requesting permanent sterilization. She's had this desire in the past and expressively wishes it. She does not have insurance, and I have discussed with the hospital ways to get this procedure completed. Because She is stable, I have added her on for Monday morning at 7 AM to complete her D&C. I have given her strict bleeding precautions, including increasing pain and heavy vaginal bleeding, or signs or symptoms of systemic infection. She will call our office or return to the emergency room if she does have these concerns.  I discussed the risks and benefits of this procedure including bleeding, infection and damage to surrounding organs. She is aware that if excessive bleeding were to occur, she might need an hysterectomy. She is amenable to this procedure and it will be completed in the next 3 days. Consents for this procedure were not signed with the patient in the emergency room because we did not have access to that paper records. She was, however, fully consented tonight.  She'll be discharged home in stable condition.  Thank you for the opportunity to be involved with this pt's care.

## 2016-06-16 NOTE — ED Provider Notes (Signed)
[facility] Emergency Department Provider Note   ____________________________________________   First MD Initiated Contact with Patient 06/16/16 1518     (approximate)  I have reviewed the triage vital signs and the nursing notes.   HISTORY  Chief Complaint Abdominal Pain    HPI Jordan [name] is a 30 y.o. female with history of interstitial cystitis, left ovarian cysts, asthma, anxiety who presents for evaluation of onset mild left lower quadrant pain radiating to the back since yesterday, constant, no modifying factors. She thinks she may have had this pain in the past related to interstitial cystitis but is not sure. She has had pain and burning with urination but she reports this is chronic. No nausea, vomiting, diarrhea, fevers or chills. She denies any abnormal vaginal bleeding or vaginal discharge over the past 2 weeks. she denies concern for sexual transmitted infection, is in a mutually monogamous sexual relationship with her partner of 3 years, has never had sexually transmitted infection. Of note, she was admitted overnight to this hospital and discharged on 05/18/2016 after treatment for a likely SAB with heavy vaginal bleeding though not ruptured ectopic pregnancy was also on the differential. She had a repeat beta hCG checked at the OB/GYN doctor's office 1 week after discharge and reports that  At that time it was decreasing appropriately.   Past Medical History:  Diagnosis Date  . Anemia   . Anxiety   . Asthma    childhood asthma  . Headache   . Heart murmur 30 years old  . Interstitial cystitis   . Interstitial cystitis 30 years old  . Placenta previa   . Restless leg syndrome     Patient Active Problem List   Diagnosis Date Noted  . Ectopic pregnancy 05/17/2016  . Left lateral abdominal pain 05/17/2016  . Post-operative state 02/14/2016  . First trimester screening 08/23/2015    Past Surgical History:  Procedure Laterality Date    . CESAREAN SECTION  2011/2013  . CESAREAN SECTION WITH BILATERAL TUBAL LIGATION Bilateral 02/14/2016   Procedure: CESAREAN SECTION;  Surgeon: Suzy Bouchard, MD;  Location: [facility] ORS;  Service: Obstetrics;  Laterality: Bilateral;  . DILATION AND CURETTAGE OF UTERUS      Prior to Admission medications   Medication Sig Start Date End Date Taking? Authorizing Provider  ferrous sulfate (FERROUSUL) 325 (65 FE) MG tablet Take 1 tablet (325 mg total) by mouth 2 (two) times daily. 05/18/16   Christeen Douglas, MD  norgestimate-ethinyl estradiol (ORTHO-CYCLEN,SPRINTEC,PREVIFEM) 0.25-35 MG-MCG tablet Take 1 tablet by mouth daily. 05/18/16   Christeen Douglas, MD    Allergies Vicodin [hydrocodone-acetaminophen] and Amoxicillin  No family history on file.  Social History Social History  Substance Use Topics  . Smoking status: Current Every Day Smoker    Packs/day: 1.00    Types: Cigarettes  . Smokeless tobacco: Never Used  . Alcohol use No    Review of Systems Constitutional: No fever/chills Eyes: No visual changes. ENT: No sore throat. Cardiovascular: Denies chest pain. Respiratory: Denies shortness of breath. Gastrointestinal: + abdominal pain.  No nausea, no vomiting.  No diarrhea.  No constipation. Genitourinary: Positive for dysuria. Musculoskeletal: Negative for back pain. Skin: Negative for rash. Neurological: Negative for headaches, focal weakness or numbness.  10-point ROS otherwise negative.  ____________________________________________   PHYSICAL EXAM:  Vitals:   06/16/16 1353 06/16/16 1910  BP: (!) 106/59 (!) 96/55  Pulse: 70 68  Resp: 18 16  Temp: 98.3 F (36.8 C)  TempSrc: Oral   SpO2: 99% 100%  Weight: 110 lb (49.9 kg)   Height: 5\' 4"  (1.626 m)     VITAL SIGNS: ED Triage Vitals  Enc Vitals Group     BP 06/16/16 1353 (!) 106/59     Pulse Rate 06/16/16 1353 70     Resp 06/16/16 1353 18     Temp 06/16/16 1353 98.3 F (36.8 C)     Temp Source  06/16/16 1353 Oral     SpO2 06/16/16 1353 99 %     Weight 06/16/16 1353 110 lb (49.9 kg)     Height 06/16/16 1353 5\' 4"  (1.626 m)     Head Circumference --      Peak Flow --      Pain Score 06/16/16 1354 5     Pain Loc --      Pain Edu? --      Excl. in GC? --     Constitutional: Alert and oriented. Well appearing and in no acute distress. Eyes: Conjunctivae are normal. PERRL. EOMI. Head: Atraumatic. Nose: No congestion/rhinnorhea. Mouth/Throat: Mucous membranes are moist.  Oropharynx non-erythematous. Neck: No stridor.  Cardiovascular: Normal rate, regular rhythm. Grossly normal heart sounds.  Good peripheral circulation. Respiratory: Normal respiratory effort.  No retractions. Lungs CTAB. Gastrointestinal: Soft with faint tenderness to palpation in the left lower abdomen in the suprapubic region, no rebound or guarding, normal bowel sounds. No CVA tenderness. Genitourinary: Pelvic exam deferred. Musculoskeletal: No lower extremity tenderness nor edema.  No joint effusions. Neurologic:  Normal speech and language. No gross focal neurologic deficits are appreciated. No gait instability. Skin:  Skin is warm, dry and intact. No rash noted. Psychiatric: Mood and affect are normal. Speech and behavior are normal.  ____________________________________________   LABS (all labs ordered are listed, but only abnormal results are displayed)  Labs Reviewed  URINALYSIS COMPLETEWITH MICROSCOPIC ([facility] ONLY) - Abnormal; Notable for the following:       Result Value   Color, Urine YELLOW (*)    APPearance CLEAR (*)    Squamous Epithelial / LPF 0-5 (*)    All other components within normal limits  HCG, QUANTITATIVE, PREGNANCY - Abnormal; Notable for the following:    hCG, Beta Chain, Quant, S 2,673 (*)    All other components within normal limits  POCT PREGNANCY, URINE - Abnormal; Notable for the following:    Preg Test, Ur POSITIVE (*)    All other components within normal limits  WET  PREP, GENITAL  CHLAMYDIA/NGC RT PCR ([facility] ONLY)  LIPASE, BLOOD  COMPREHENSIVE METABOLIC PANEL  CBC  POC URINE PREG, ED   ____________________________________________  EKG  none ____________________________________________  RADIOLOGY  Transvaginal ultrasound IMPRESSION: 1. No intrauterine gestational sac. Thickened (15 mm) and heterogeneous endometrium, with the suggestion of a mixed echogenicity hypervascular 2.2 cm mass in the fundal endometrium. Given the beta HCG level greater than 2000, the decline of the beta HCG level since one month prior and the absence of suspicious adnexal masses, the most likely diagnosis is a spontaneous abortion with retained products of conception. The possibility of a molar pregnancy cannot be excluded given the tiny cystic components within the endometrial mass. OB/GYN consultation advised. Hysteroscopy / D&C should be considered. 2. Normal ovaries. No evidence of adnexal torsion. ____________________________________________   PROCEDURES  Procedure(s) performed: None  Procedures  Critical Care performed: No  ____________________________________________   INITIAL IMPRESSION / ASSESSMENT AND PLAN / ED COURSE  Pertinent labs & imaging results that were available  during my care of the patient were reviewed by me and considered in my medical decision making (see chart for details).  HENYA AGUALLO is a 30 y.o. female with history of interstitial cystitis, left ovarian cysts, asthma, anxiety who presents for evaluation of onset mild left lower quadrant pain radiating to the back since yesterday. On exam, she is very well-appearing and in no acute distress. Vital signs stable, she is afebrile. She has very mild tenderness to palpation in the left lower abdomen, no rebound or guarding. Prevacid chest is positive, beta hCG is only 2000 however will obtain ultrasound to rule out ectopic given that this was on the differential. We'll  also rule out torsion with ultrasound, evaluate for ovarian cyst or ruptured cyst as the cause of her pain. We'll obtain screening abdominal labs. She has declined any medication for pain stating her pain is well controlled.  ----------------------------------------- 6:21 PM on 06/16/2016 ----------------------------------------- Labs reviewed. Unremarkable CBC, CMP, lipase. Urinalysis is not consistent with infection. Beta hCG was elevated at greater than 2000. Ultrasound shows concern for retained products of conception though molar pregnancy cannot be excluded. I discussed the case with Dr. Christeen Douglas who will evaluate the patient in the emergency department.  ----------------------------------------- 7:22 PM on 06/16/2016 ----------------------------------------- Dr. Dalbert Garnet has evaluated the patient and as patient is stable with no pain or bleeding she recommends dc and patient will f/u with her Monday morning for [facility] for treatment of possibly retained products of conception. Wet prep and GC/chlamydia sent and Dr. Dalbert Garnet to follow-up results Monday. Discussed return precautions and need for close follow-up Monday and the patient is comfortable with the discharge plan. DC home. Blood pressure at the time of discharge is 96/55 but I have reviewed the patient's chart and this is her baseline.    Clinical Course     ____________________________________________   FINAL CLINICAL IMPRESSION(S) / ED DIAGNOSES  Final diagnoses:  Pain  Left lower quadrant pain  Retained products of conception without hemorrhage      NEW MEDICATIONS STARTED DURING THIS VISIT:  New Prescriptions   No medications on file     Note:  This document was prepared using Dragon voice recognition software and may include unintentional dictation errors.    Gayla Doss, MD 06/16/16 [phone]

## 2016-06-18 ENCOUNTER — Ambulatory Visit
Admission: AD | Admit: 2016-06-18 | Discharge: 2016-06-18 | Disposition: A | Discharge status: Home / Self Care | Payer: Self-pay | Source: Ambulatory Visit | Attending: Obstetrics and Gynecology | Admitting: Obstetrics and Gynecology

## 2016-06-18 ENCOUNTER — Inpatient Hospital Stay: Payer: Self-pay | Admitting: Anesthesiology

## 2016-06-18 ENCOUNTER — Ambulatory Visit
Admission: RE | Admit: 2016-06-18 | Discharge: 2016-06-18 | Disposition: A | Discharge status: Home / Self Care | Payer: Self-pay | Source: Ambulatory Visit | Attending: Obstetrics and Gynecology | Admitting: Obstetrics and Gynecology

## 2016-06-18 ENCOUNTER — Encounter
Admission: RE | Disposition: A | Discharge status: Home / Self Care | Payer: Self-pay | Source: Ambulatory Visit | Attending: Obstetrics and Gynecology

## 2016-06-18 DIAGNOSIS — Z881 Allergy status to other antibiotic agents status: Secondary | ICD-10-CM | POA: Insufficient documentation

## 2016-06-18 DIAGNOSIS — Z79899 Other long term (current) drug therapy: Secondary | ICD-10-CM | POA: Insufficient documentation

## 2016-06-18 DIAGNOSIS — N858 Other specified noninflammatory disorders of uterus: Secondary | ICD-10-CM | POA: Insufficient documentation

## 2016-06-18 DIAGNOSIS — G2581 Restless legs syndrome: Secondary | ICD-10-CM | POA: Insufficient documentation

## 2016-06-18 DIAGNOSIS — F17200 Nicotine dependence, unspecified, uncomplicated: Secondary | ICD-10-CM | POA: Insufficient documentation

## 2016-06-18 DIAGNOSIS — D649 Anemia, unspecified: Secondary | ICD-10-CM | POA: Insufficient documentation

## 2016-06-18 DIAGNOSIS — R51 Headache: Secondary | ICD-10-CM | POA: Insufficient documentation

## 2016-06-18 DIAGNOSIS — F419 Anxiety disorder, unspecified: Secondary | ICD-10-CM | POA: Insufficient documentation

## 2016-06-18 DIAGNOSIS — Z885 Allergy status to narcotic agent status: Secondary | ICD-10-CM | POA: Insufficient documentation

## 2016-06-18 DIAGNOSIS — J45909 Unspecified asthma, uncomplicated: Secondary | ICD-10-CM | POA: Insufficient documentation

## 2016-06-18 DIAGNOSIS — O021 Missed abortion: Secondary | ICD-10-CM | POA: Insufficient documentation

## 2016-06-18 HISTORY — PX: DILATION AND CURETTAGE OF UTERUS: SHX78

## 2016-06-18 LAB — TYPE AND SCREEN
ABO/RH(D): A POS
ANTIBODY SCREEN: NEGATIVE

## 2016-06-18 SURGERY — DILATION AND CURETTAGE
Anesthesia: General

## 2016-06-18 MED ORDER — ONDANSETRON HCL 4 MG/2ML IJ SOLN
INTRAMUSCULAR | Status: DC | PRN
  Administered 2016-06-18: 4 mg via INTRAVENOUS

## 2016-06-18 MED ORDER — DOXYCYCLINE HYCLATE 100 MG PO TABS
100.0000 mg | ORAL_TABLET | Freq: Once | ORAL | Status: AC
  Administered 2016-06-18: 100 mg via ORAL
  Filled 2016-06-18: qty 1

## 2016-06-18 MED ORDER — DOCUSATE SODIUM 100 MG PO CAPS: 100.0000 mg | ORAL_CAPSULE | Freq: Two times a day (BID) | ORAL | 2 refills | Status: DC | PRN

## 2016-06-18 MED ORDER — FENTANYL CITRATE (PF) 100 MCG/2ML IJ SOLN
INTRAMUSCULAR | Status: DC | PRN
  Administered 2016-06-18 (×4): 25 ug via INTRAVENOUS

## 2016-06-18 MED ORDER — ONDANSETRON 4 MG PO TBDP: 4.0000 mg | ORAL_TABLET | Freq: Four times a day (QID) | ORAL | 0 refills | Status: DC | PRN

## 2016-06-18 MED ORDER — KETOROLAC TROMETHAMINE 30 MG/ML IJ SOLN
30.0000 mg | Freq: Once | INTRAMUSCULAR | Status: AC
  Administered 2016-06-18: 30 mg via INTRAVENOUS

## 2016-06-18 MED ORDER — OXYCODONE HCL 5 MG/5ML PO SOLN: 5.0000 mg | Freq: Once | ORAL | Status: DC | PRN

## 2016-06-18 MED ORDER — MEPERIDINE HCL 25 MG/ML IJ SOLN: 6.2500 mg | INTRAMUSCULAR | Status: DC | PRN

## 2016-06-18 MED ORDER — LACTATED RINGERS IV SOLN
INTRAVENOUS | Status: DC
  Administered 2016-06-18: 10:00:00 via INTRAVENOUS

## 2016-06-18 MED ORDER — MIDAZOLAM HCL 2 MG/2ML IJ SOLN
INTRAMUSCULAR | Status: DC | PRN
  Administered 2016-06-18: 2 mg via INTRAVENOUS

## 2016-06-18 MED ORDER — FENTANYL CITRATE (PF) 100 MCG/2ML IJ SOLN: 25.0000 ug | INTRAMUSCULAR | Status: DC | PRN

## 2016-06-18 MED ORDER — METHYLERGONOVINE MALEATE 0.2 MG/ML IJ SOLN
INTRAMUSCULAR | Status: AC
  Filled 2016-06-18: qty 1

## 2016-06-18 MED ORDER — GLYCOPYRROLATE 0.2 MG/ML IJ SOLN
INTRAMUSCULAR | Status: DC | PRN
  Administered 2016-06-18: 0.2 mg via INTRAVENOUS

## 2016-06-18 MED ORDER — KETOROLAC TROMETHAMINE 30 MG/ML IJ SOLN
INTRAMUSCULAR | Status: AC
  Filled 2016-06-18: qty 1

## 2016-06-18 MED ORDER — DEXAMETHASONE SODIUM PHOSPHATE 10 MG/ML IJ SOLN
INTRAMUSCULAR | Status: DC | PRN
  Administered 2016-06-18: 10 mg via INTRAVENOUS

## 2016-06-18 MED ORDER — DOXYCYCLINE HYCLATE 100 MG PO TABS
200.0000 mg | ORAL_TABLET | Freq: Once | ORAL | Status: AC
  Administered 2016-06-18: 200 mg via ORAL
  Filled 2016-06-18: qty 2

## 2016-06-18 MED ORDER — SILVER NITRATE-POT NITRATE 75-25 % EX MISC
CUTANEOUS | Status: AC
  Filled 2016-06-18: qty 7

## 2016-06-18 MED ORDER — PROPOFOL 10 MG/ML IV BOLUS
INTRAVENOUS | Status: DC | PRN
  Administered 2016-06-18: 120 mg via INTRAVENOUS

## 2016-06-18 MED ORDER — SILVER NITRATE-POT NITRATE 75-25 % EX MISC
CUTANEOUS | Status: AC
  Filled 2016-06-18: qty 5

## 2016-06-18 MED ORDER — PROMETHAZINE HCL 25 MG/ML IJ SOLN: 6.2500 mg | INTRAMUSCULAR | Status: DC | PRN

## 2016-06-18 MED ORDER — LACTATED RINGERS IV SOLN: INTRAVENOUS | Status: DC

## 2016-06-18 MED ORDER — IBUPROFEN 800 MG PO TABS: 800.0000 mg | ORAL_TABLET | Freq: Three times a day (TID) | ORAL | 0 refills | Status: DC | PRN

## 2016-06-18 MED ORDER — OXYCODONE HCL 5 MG PO TABS: 5.0000 mg | ORAL_TABLET | Freq: Once | ORAL | Status: DC | PRN

## 2016-06-18 MED ORDER — LIDOCAINE HCL (CARDIAC) 20 MG/ML IV SOLN
INTRAVENOUS | Status: DC | PRN
  Administered 2016-06-18: 60 mg via INTRAVENOUS

## 2016-06-18 SURGICAL SUPPLY — 14 items
CATH ROBINSON RED A/P 16FR (CATHETERS) ×3 IMPLANT
FILTER UTR ASPR SPEC (MISCELLANEOUS) ×1 IMPLANT
FLTR UTR ASPR SPEC (MISCELLANEOUS) ×3
GLOVE BIO SURGEON STRL SZ 6.5 (GLOVE) ×6 IMPLANT
GLOVE BIO SURGEONS STRL SZ 6.5 (GLOVE) ×3
GOWN STRL REUS W/ TWL LRG LVL3 (GOWN DISPOSABLE) ×2 IMPLANT
GOWN STRL REUS W/TWL LRG LVL3 (GOWN DISPOSABLE) ×4
KIT BERKELEY 1ST TRIMESTER 3/8 (MISCELLANEOUS) ×3 IMPLANT
KIT RM TURNOVER CYSTO AR (KITS) ×3 IMPLANT
PACK DNC HYST (MISCELLANEOUS) ×3 IMPLANT
PAD OB MATERNITY 4.3X12.25 (PERSONAL CARE ITEMS) ×3 IMPLANT
PAD PREP 24X41 OB/GYN DISP (PERSONAL CARE ITEMS) ×3 IMPLANT
SET BERKELEY SUCTION TUBING (SUCTIONS) ×3 IMPLANT
VACURETTE 8 RIGID CVD (CANNULA) ×3 IMPLANT

## 2016-06-18 NOTE — Transfer of Care (Signed)
Immediate Anesthesia Transfer of Care Note  Patient: Jordan [name]  Procedure(s) Performed: Procedure(s): DILATATION AND CURETTAGE (N/A)  Patient Location: PACU  Anesthesia Type:General  Level of Consciousness: sedated  Airway & Oxygen Therapy: Patient Spontanous Breathing and Patient connected to face mask oxygen  Post-op Assessment: Report given to RN and Post -op Vital signs reviewed and stable  Post vital signs: Reviewed and stable  Last Vitals:  Vitals:   06/18/16 0915  BP: (!) 103/50  Pulse: 63  Resp: 18  Temp: 36.3 C    Last Pain:  Vitals:   06/18/16 0915  TempSrc: Oral  PainSc: 4          Complications: No apparent anesthesia complications

## 2016-06-18 NOTE — Discharge Instructions (Signed)
AMBULATORY SURGERY  DISCHARGE INSTRUCTIONS   1) The drugs that you were given will stay in your system until tomorrow so for the next 24 hours you should not:  A) Drive an automobile B) Make any legal decisions C) Drink any alcoholic beverage   2) You may resume regular meals tomorrow.  Today it is better to start with liquids and gradually work up to solid foods.  You may eat anything you prefer, but it is better to start with liquids, then soup and crackers, and gradually work up to solid foods.   3) Please notify your doctor immediately if you have any unusual bleeding, trouble breathing, redness and pain at the surgery site, drainage, fever, or pain not relieved by medications.        Please contact your physician with any problems or Same Day Surgery at [phone], Monday through Friday 6 am to 4 pm, or [place] at [facility] Main number at [phone].

## 2016-06-18 NOTE — Anesthesia Preprocedure Evaluation (Signed)
Anesthesia Evaluation  Patient identified by MRN, date of birth, ID band Patient awake    Reviewed: Allergy & Precautions, NPO status , Patient's Chart, lab work & pertinent test results  History of Anesthesia Complications Negative for: history of anesthetic complications  Airway Mallampati: II  TM Distance: >3 FB Neck ROM: Full    Dental no notable dental hx.    Pulmonary asthma , Current Smoker,    breath sounds clear to auscultation- rhonchi (-) wheezing      Cardiovascular Exercise Tolerance: Good (-) hypertension(-) CAD and (-) Past MI  Rhythm:Regular Rate:Normal - Systolic murmurs and - Diastolic murmurs    Neuro/Psych  Headaches, Anxiety    GI/Hepatic negative GI ROS, Neg liver ROS,   Endo/Other  negative endocrine ROSneg diabetes  Renal/GU negative Renal ROS     Musculoskeletal   Abdominal (+) - obese,   Peds  Hematology  (+) anemia ,   Anesthesia Other Findings Past Medical History: No date: Anemia No date: Anxiety No date: Asthma     Comment: childhood asthma No date: Headache 30 years old: Heart murmur No date: Interstitial cystitis 30 years old: Interstitial cystitis No date: Placenta previa No date: Restless leg syndrome   Reproductive/Obstetrics                             Anesthesia Physical Anesthesia Plan  ASA: II  Anesthesia Plan: General   Post-op Pain Management:    Induction: Intravenous  Airway Management Planned: LMA  Additional Equipment:   Intra-op Plan:   Post-operative Plan:   Informed Consent: I have reviewed the patients History and Physical, chart, labs and discussed the procedure including the risks, benefits and alternatives for the proposed anesthesia with the patient or authorized representative who has indicated his/her understanding and acceptance.   Dental advisory given  Plan Discussed with: Anesthesiologist and  CRNA  Anesthesia Plan Comments:         Anesthesia Quick Evaluation

## 2016-06-18 NOTE — Anesthesia Postprocedure Evaluation (Signed)
Anesthesia Post Note  Patient: Rogelia Miremanda D Craze  Procedure(s) Performed: Procedure(s) (LRB): DILATATION AND CURETTAGE (N/A)  Patient location during evaluation: PACU Anesthesia Type: General Level of consciousness: awake and alert and oriented Pain management: pain level controlled Vital Signs Assessment: post-procedure vital signs reviewed and stable Respiratory status: spontaneous breathing, nonlabored ventilation and respiratory function stable Cardiovascular status: blood pressure returned to baseline and stable Postop Assessment: no signs of nausea or vomiting Anesthetic complications: no    Last Vitals:  Vitals:   06/18/16 1137 06/18/16 1203  BP: (!) 93/54 (!) 102/55  Pulse: 62   Resp:    Temp: 36.1 C     Last Pain:  Vitals:   06/18/16 1218  TempSrc:   PainSc: 0-No pain                 Shanyah Gattuso

## 2016-06-18 NOTE — Pre-Procedure Instructions (Signed)
Tongue ring removed and given to Dillion.

## 2016-06-18 NOTE — Op Note (Signed)
Operative Report Suction Dilation and Curettage   Indications: Retained products of conception   Pre-operative Diagnosis: Incomplete abortion with hypervascular area noted in uterine fundus  Post-operative Diagnosis: same.  Procedure: 1. Suction D&C  Surgeon: Christeen DouglasBethany Kirsten Mckone, MD  Assistant(s):  None  Anesthesia: General endotracheal anesthesia  Anesthesiologist: Alver FisherAmy Penwarden, MD Anesthesiologist: Alver FisherAmy Penwarden, MD CRNA: Malva Coganatherine Beane, CRNA  Estimated Blood Loss:  25         Intraoperative medications: none required         Total IV Fluids: 700ml  Urine Output: 50ml         Specimens: endometrial currettings         Complications:  None; patient tolerated the procedure well.         Disposition: PACU - hemodynamically stable.         Condition: stable  Findings: Retroverted Uterus- normal sized, normal cervix, vagina, perineum.   Indication for procedure/Consents: 30 y.o. [id number] at unknown GA [facility] suspected retained products of conception. She did have a spontaneous abortion that has been monitored inconsistently with beta hcg. She is having no vaginal bleeding but increasing abdominal/pelvic pain and an ultrasound found a hypervascular area in the fundus.  Risks of surgery were discussed with the patient including but not limited to: bleeding which may require transfusion; infection which may require antibiotics; injury to uterus or surrounding organs; intrauterine scarring which may impair future fertility; need for additional procedures including laparotomy or laparoscopy; and other postoperative/anesthesia complications. Written informed consent was obtained.    Procedure Details:   The patient received oral antibiotics while in the preoperative area.  She was then taken to the operating room where general anesthesia was administered and was found to be adequate.  After a formal and adequate timeout was performed, she was placed in the dorsal lithotomy  position and examined with the above findings. She was then prepped and draped in the sterile manner.   Her bladder was catheterized for an estimated amount of clear, yellow urine. A speculum was then placed in the patient's vagina and a single tooth tenaculum was applied to the anterior lip of the cervix.    No uterine sounding was performed on this pregnant uterus. Her cervix was serially dilated to accommodate a 6 sized rigid suction curette.  A sharp curettage was then performed until there was a gritty texture in all four quadrants.  The tenaculum was removed from the anterior lip of the cervix and the vaginal speculum was removed after noting good hemostasis. The patient tolerated the procedure well and was taken to the recovery area awake, extubated and in stable condition.  The patient will be discharged to home as per PACU criteria.  She will receive another dose of oral antibiotics prior to discharge. Routine postoperative instructions given.  She was prescribed Percocet, Ibuprofen and Colace.  She will follow up in the clinic in two weeks for postoperative evaluation.

## 2016-06-18 NOTE — OR Nursing (Signed)
Peri pad clean and dry.

## 2016-06-18 NOTE — Anesthesia Procedure Notes (Signed)
Procedure Name: LMA Insertion Performed by: Gerrianne Aydelott Pre-anesthesia Checklist: Patient identified, Patient being monitored, Timeout performed, Emergency Drugs available and Suction available Patient Re-evaluated:Patient Re-evaluated prior to inductionOxygen Delivery Method: Circle system utilized Preoxygenation: Pre-oxygenation with 100% oxygen Intubation Type: IV induction Ventilation: Mask ventilation without difficulty LMA: LMA inserted LMA Size: 4.0 Tube type: Oral Number of attempts: 1 Placement Confirmation: positive ETCO2 and breath sounds checked- equal and bilateral Tube secured with: Tape Dental Injury: Teeth and Oropharynx as per pre-operative assessment      

## 2016-06-19 ENCOUNTER — Encounter: Payer: Self-pay | Admitting: Obstetrics and Gynecology

## 2016-06-21 LAB — SURGICAL PATHOLOGY

## 2017-04-22 DIAGNOSIS — R079 Chest pain, unspecified: Secondary | ICD-10-CM | POA: Diagnosis not present

## 2017-04-22 DIAGNOSIS — R42 Dizziness and giddiness: Secondary | ICD-10-CM | POA: Diagnosis present

## 2017-04-22 DIAGNOSIS — Z880 Allergy status to penicillin: Secondary | ICD-10-CM | POA: Diagnosis not present

## 2017-04-22 DIAGNOSIS — F17210 Nicotine dependence, cigarettes, uncomplicated: Secondary | ICD-10-CM | POA: Insufficient documentation

## 2017-04-22 DIAGNOSIS — J45909 Unspecified asthma, uncomplicated: Secondary | ICD-10-CM | POA: Diagnosis not present

## 2017-04-22 LAB — URINALYSIS, COMPLETE (UACMP) WITH MICROSCOPIC
BILIRUBIN URINE: NEGATIVE
Glucose, UA: NEGATIVE mg/dL
KETONES UR: 5 mg/dL — AB
LEUKOCYTES UA: NEGATIVE
Nitrite: NEGATIVE
PH: 5 (ref 5.0–8.0)
PROTEIN: NEGATIVE mg/dL
Specific Gravity, Urine: 1.027 (ref 1.005–1.030)

## 2017-04-22 LAB — CBC
HEMATOCRIT: 35.3 % (ref 35.0–47.0)
Hemoglobin: 11.7 g/dL — ABNORMAL LOW (ref 12.0–16.0)
MCH: 27.3 pg (ref 26.0–34.0)
MCHC: 33.1 g/dL (ref 32.0–36.0)
MCV: 82.5 fL (ref 80.0–100.0)
PLATELETS: 272 10*3/uL (ref 150–440)
RBC: 4.27 MIL/uL (ref 3.80–5.20)
RDW: 13.5 % (ref 11.5–14.5)
WBC: 9 10*3/uL (ref 3.6–11.0)

## 2017-04-22 LAB — BASIC METABOLIC PANEL
Anion gap: 5 (ref 5–15)
BUN: 8 mg/dL (ref 6–20)
CHLORIDE: 110 mmol/L (ref 101–111)
CO2: 24 mmol/L (ref 22–32)
CREATININE: 0.84 mg/dL (ref 0.44–1.00)
Calcium: 9 mg/dL (ref 8.9–10.3)
GFR calc non Af Amer: >60 mL/min (ref >60)
Glucose, Bld: 82 mg/dL (ref 65–99)
POTASSIUM: 3.4 mmol/L — AB (ref 3.5–5.1)
Sodium: 139 mmol/L (ref 135–145)

## 2017-04-22 NOTE — ED Triage Notes (Signed)
[facility].  Reports pain in her chest that is worse with breathing, dizziness and light headedness.  Reports [facility] has diagnoses her with respiratory issues.

## 2017-04-23 ENCOUNTER — Emergency Department
Admission: EM | Admit: 2017-04-23 | Discharge: 2017-04-23 | Disposition: A | Discharge status: Home / Self Care | Payer: Medicaid Other | Attending: Emergency Medicine | Admitting: Emergency Medicine

## 2017-04-23 ENCOUNTER — Emergency Department: Payer: Medicaid Other

## 2017-04-23 ENCOUNTER — Encounter: Payer: Self-pay | Admitting: Radiology

## 2017-04-23 DIAGNOSIS — R0789 Other chest pain: Secondary | ICD-10-CM

## 2017-04-23 DIAGNOSIS — R079 Chest pain, unspecified: Secondary | ICD-10-CM

## 2017-04-23 DIAGNOSIS — R42 Dizziness and giddiness: Secondary | ICD-10-CM

## 2017-04-23 LAB — TROPONIN I: Troponin I: <0.03 ng/mL (ref <0.03)

## 2017-04-23 MED ORDER — IOPAMIDOL (ISOVUE-370) INJECTION 76%
75.0000 mL | Freq: Once | INTRAVENOUS | Status: AC | PRN
  Administered 2017-04-23: 75 mL via INTRAVENOUS

## 2017-04-23 MED ORDER — NAPROXEN 500 MG PO TABS: 500.0000 mg | ORAL_TABLET | Freq: Two times a day (BID) | ORAL | 0 refills | Status: DC

## 2017-04-23 MED ORDER — SODIUM CHLORIDE 0.9 % IV BOLUS (SEPSIS)
1000.0000 mL | Freq: Once | INTRAVENOUS | Status: AC
  Administered 2017-04-23: 1000 mL via INTRAVENOUS

## 2017-04-23 NOTE — ED Provider Notes (Signed)
[facility] Emergency Department Provider Note   ____________________________________________   First MD Initiated Contact with Patient 04/23/17 0221     (approximate)  I have reviewed the triage vital signs and the nursing notes.   HISTORY  Chief Complaint Dizziness and Chest Pain    HPI Jordan [name] is a 31 y.o. female who presents to the ED from home a chief complaint of chest pain, shortness of breathand dizziness. Over the past 4-6 weeks, patient has been seen at [facility] 3 times for similar presentations. States she is diagnosed with a respiratory illness each time. Has been on antibiotics as well as steroid taper. States she is no longer coughing but continues to have sharp left-sided chest pain which is worse with breathing. Symptoms associated with dizziness and lightheadedness. Denies associated fever, chills, diaphoresis, abdominal pain, nausea, vomiting, diarrhea. Denies recent travel or trauma. Nothing makes her symptoms better. Breathing makes her symptoms worse.   Past Medical History:  Diagnosis Date  . Anemia   . Anxiety   . Asthma    childhood asthma  . Headache   . Heart murmur 31 years old  . Interstitial cystitis   . Interstitial cystitis 31 years old  . Placenta previa   . Restless leg syndrome     Patient Active Problem List   Diagnosis Date Noted  . Ectopic pregnancy 05/17/2016  . Left lateral abdominal pain 05/17/2016  . Post-operative state 02/14/2016  . First trimester screening 08/23/2015    Past Surgical History:  Procedure Laterality Date  . CESAREAN SECTION  2011/2013  . CESAREAN SECTION WITH BILATERAL TUBAL LIGATION Bilateral 02/14/2016   Procedure: CESAREAN SECTION;  Surgeon: Suzy Bouchard, MD;  Location: [facility] ORS;  Service: Obstetrics;  Laterality: Bilateral;  . DILATION AND CURETTAGE OF UTERUS    . DILATION AND CURETTAGE OF UTERUS N/A 06/18/2016   Procedure: DILATATION AND CURETTAGE;  Surgeon: Christeen Douglas, MD;  Location: [facility] ORS;  Service: Gynecology;  Laterality: N/A;    Prior to Admission medications   Medication Sig Start Date End Date Taking? Authorizing Provider  docusate sodium (COLACE) 100 MG capsule Take 1 capsule (100 mg total) by mouth 2 (two) times daily as needed. 06/18/16   Christeen Douglas, MD  ferrous sulfate (FERROUSUL) 325 (65 FE) MG tablet Take 1 tablet (325 mg total) by mouth 2 (two) times daily. 05/18/16   Christeen Douglas, MD  ibuprofen (ADVIL,MOTRIN) 800 MG tablet Take 1 tablet (800 mg total) by mouth every 8 (eight) hours as needed. 06/18/16   Christeen Douglas, MD  naproxen (NAPROSYN) 500 MG tablet Take 1 tablet (500 mg total) by mouth 2 (two) times daily with a meal. 04/23/17   Irean Hong, MD  norgestimate-ethinyl estradiol (ORTHO-CYCLEN,SPRINTEC,PREVIFEM) 0.25-35 MG-MCG tablet Take 1 tablet by mouth daily. 05/18/16   Christeen Douglas, MD  ondansetron (ZOFRAN ODT) 4 MG disintegrating tablet Take 1 tablet (4 mg total) by mouth every 6 (six) hours as needed for nausea. 06/18/16   Christeen Douglas, MD    Allergies Vicodin [hydrocodone-acetaminophen] and Amoxicillin  No family history on file.  Social History Social History  Substance Use Topics  . Smoking status: Current Every Day Smoker    Packs/day: 1.00    Types: Cigarettes  . Smokeless tobacco: Never Used  . Alcohol use No    Review of Systems  Constitutional: No fever/chills. Eyes: No visual changes. ENT: No sore throat. Cardiovascular: Positive for chest pain. Respiratory: Positive for shortness of breath. Gastrointestinal:  No abdominal pain.  No nausea, no vomiting.  No diarrhea.  No constipation. Genitourinary: Negative for dysuria. Musculoskeletal: Negative for back pain. Skin: Negative for rash. Neurological: Positive for dizziness and lightheadedness. Negative for headaches, focal weakness or numbness.   ____________________________________________   PHYSICAL EXAM:  VITAL SIGNS: ED  Triage Vitals  Enc Vitals Group     BP 04/22/17 2206 111/65     Pulse Rate 04/22/17 2206 73     Resp 04/22/17 2206 18     Temp --      Temp src --      SpO2 04/22/17 2206 100 %     Weight 04/22/17 2205 102 lb (46.3 kg)     Height 04/22/17 2205 5\' 4"  (1.626 m)     Head Circumference --      Peak Flow --      Pain Score 04/23/17 0122 2     Pain Loc --      Pain Edu? --      Excl. in GC? --     Constitutional: Asleep, awakened for exam. Alert and oriented. Well appearing and in no acute distress. Eyes: Conjunctivae are normal. PERRL. EOMI. Head: Atraumatic. Nose: No congestion/rhinnorhea. Mouth/Throat: Mucous membranes are moist.  Oropharynx non-erythematous. Neck: No stridor.   Cardiovascular: Normal rate, regular rhythm. Grossly normal heart sounds.  Good peripheral circulation. Respiratory: Normal respiratory effort.  No retractions. Lungs CTAB. Left anterior chest wall tender to palpation. Gastrointestinal: Soft and nontender. No distention. No abdominal bruits. No CVA tenderness. Musculoskeletal: No lower extremity tenderness nor edema.  No joint effusions. Neurologic:  Alert and oriented 3. CN II-XII grossly intact. Normal speech and language. No gross focal neurologic deficits are appreciated. No gait instability. Skin:  Skin is warm, dry and intact. No rash noted. Psychiatric: Mood and affect are normal. Speech and behavior are normal.  ____________________________________________   LABS (all labs ordered are listed, but only abnormal results are displayed)  Labs Reviewed  BASIC METABOLIC PANEL - Abnormal; Notable for the following:       Result Value   Potassium 3.4 (*)    All other components within normal limits  CBC - Abnormal; Notable for the following:    Hemoglobin 11.7 (*)    All other components within normal limits  URINALYSIS, COMPLETE (UACMP) WITH MICROSCOPIC - Abnormal; Notable for the following:    Color, Urine YELLOW (*)    APPearance HAZY (*)     Hgb urine dipstick MODERATE (*)    Ketones, ur 5 (*)    Bacteria, UA RARE (*)    Squamous Epithelial / LPF 0-5 (*)    All other components within normal limits  TROPONIN I   ____________________________________________  EKG  ED ECG REPORT I, Arihanna Estabrook J, the attending physician, personally viewed and interpreted this ECG.   Date: 04/23/2017  EKG Time: 2206  Rate: 68  Rhythm: normal EKG, normal sinus rhythm  Axis: RAD  Intervals:none  ST&T Change: Nonspecific  ____________________________________________  RADIOLOGY  Ct Angio Chest Pe W/cm &/or Wo Cm  Result Date: 04/23/2017 CLINICAL DATA:  Chest pain and difficulty breathing. EXAM: CT ANGIOGRAPHY CHEST WITH CONTRAST TECHNIQUE: Multidetector CT imaging of the chest was performed using the standard protocol during bolus administration of intravenous contrast. Multiplanar CT image reconstructions and MIPs were obtained to evaluate the vascular anatomy. CONTRAST:  75 mL Isovue 370 COMPARISON:  Chest radiograph 09/04/2011 and chest CT 10/14/2010 FINDINGS: Cardiovascular: Contrast injection is sufficient to demonstrate satisfactory opacification of the  pulmonary arteries to the segmental level. There is no pulmonary embolus. The main pulmonary artery is within normal limits for size. There is no CT evidence of acute right heart strain. The visualized aorta is normal. There is a normal 3-vessel arch branching pattern. Heart size is normal, without pericardial effusion. Mediastinum/Nodes: No mediastinal, hilar or axillary lymphadenopathy. The visualized thyroid and thoracic esophageal course are unremarkable. Lungs/Pleura: No pulmonary nodules or masses. No pleural effusion or pneumothorax. No focal airspace consolidation. No focal pleural abnormality. Upper Abdomen: Contrast bolus timing is not optimized for evaluation of the abdominal organs. Within this limitation, the visualized organs of the upper abdomen are normal. Musculoskeletal: No chest  wall abnormality. No acute or significant osseous findings. Review of the MIP images confirms the above findings. IMPRESSION: No pulmonary embolus or acute aortic syndrome. Electronically Signed   By: Deatra RobinsonKevin  Herman M.D.   On: 04/23/2017 03:13    ____________________________________________   PROCEDURES  Procedure(s) performed: None  Procedures  Critical Care performed: No  ____________________________________________   INITIAL IMPRESSION / ASSESSMENT AND PLAN / ED COURSE  Pertinent labs & imaging results that were available during my care of the patient were reviewed by me and considered in my medical decision making (see chart for details).  31 year old female who presents with 4-6 weeks of chest pain, shortness of breath, dizziness and lightheadedness. Although patient's [facility] weeks [facility] demonstrates negative d-dimer, we discussed risk/benefits of obtaining CT chest to evaluate for pulmonary embolism. Patient desires to proceed with CT scanning. Will initiate IV fluid resuscitation, orthostatic vital signs, troponin and reassess.  Clinical Course as of Apr 23 736  Mon Apr 23, 2017  [id number] Patient sleeping soundly in no acute distress. Updated her of negative CT scan. Strict return precautions given. Patient verbalizes understanding and agrees with plan of care.  [JS]    Clinical Course User Index [JS] Irean HongSung, Lejon Afzal J, MD     ____________________________________________   FINAL CLINICAL IMPRESSION(S) / ED DIAGNOSES  Final diagnoses:  Chest wall pain  Chest pain, unspecified type  Dizziness      NEW MEDICATIONS STARTED DURING THIS VISIT:  Discharge Medication List as of 04/23/2017  4:56 AM    START taking these medications   Details  naproxen (NAPROSYN) 500 MG tablet Take 1 tablet (500 mg total) by mouth 2 (two) times daily with a meal., Starting Mon 04/23/2017, Print         Note:  This document was prepared using Dragon voice recognition software and may  include unintentional dictation errors.    Irean HongSung, Tamatha Gadbois J, MD 04/23/17 [phone]

## 2017-04-23 NOTE — Discharge Instructions (Signed)
1. Take Naprosyn 500 mg twice daily 7 days as needed for chest discomfort. 2. Return to the ER for worsening symptoms, persistent vomiting, difficulty breathing or other concerns.

## 2017-04-23 NOTE — ED Notes (Signed)
Patient transported to CT 

## 2017-07-03 ENCOUNTER — Ambulatory Visit: Payer: Medicaid Other | Admitting: Cardiovascular Disease

## 2017-07-03 ENCOUNTER — Telehealth: Payer: Self-pay

## 2017-07-03 NOTE — Telephone Encounter (Signed)
Pt seen in ED in June for chest pain and palpitations.  She has a scheduled new patient visit today with Dr. Kirke Corin. Pt saw Dr. Darrold Junker November 2016. Attempted to reach pt to see if she wants f/u with our office or [facility]. Home number recording "can not complete call as dialed" Called mobile number. Rings then stops. No voice mail

## 2017-07-04 ENCOUNTER — Encounter: Payer: Self-pay | Admitting: Cardiovascular Disease

## 2017-10-26 ENCOUNTER — Encounter: Payer: Self-pay | Admitting: Advanced Practice Midwife

## 2017-12-31 ENCOUNTER — Other Ambulatory Visit: Payer: Self-pay

## 2017-12-31 ENCOUNTER — Encounter: Payer: Self-pay | Admitting: Emergency Medicine

## 2017-12-31 ENCOUNTER — Emergency Department
Admission: EM | Admit: 2017-12-31 | Discharge: 2017-12-31 | Disposition: A | Discharge status: Home / Self Care | Payer: Medicaid Other | Attending: Emergency Medicine | Admitting: Emergency Medicine

## 2017-12-31 ENCOUNTER — Emergency Department: Payer: Medicaid Other

## 2017-12-31 DIAGNOSIS — R002 Palpitations: Secondary | ICD-10-CM | POA: Diagnosis not present

## 2017-12-31 DIAGNOSIS — F17200 Nicotine dependence, unspecified, uncomplicated: Secondary | ICD-10-CM | POA: Diagnosis not present

## 2017-12-31 DIAGNOSIS — J40 Bronchitis, not specified as acute or chronic: Secondary | ICD-10-CM

## 2017-12-31 LAB — COMPREHENSIVE METABOLIC PANEL
ALBUMIN: 4.3 g/dL (ref 3.5–5.0)
ALT: 13 U/L — AB (ref 14–54)
ANION GAP: 6 (ref 5–15)
AST: 22 U/L (ref 15–41)
Alkaline Phosphatase: 75 U/L (ref 38–126)
BILIRUBIN TOTAL: 0.7 mg/dL (ref 0.3–1.2)
BUN: 7 mg/dL (ref 6–20)
CALCIUM: 9 mg/dL (ref 8.9–10.3)
CO2: 23 mmol/L (ref 22–32)
CREATININE: 0.72 mg/dL (ref 0.44–1.00)
Chloride: 105 mmol/L (ref 101–111)
GFR calc non Af Amer: >60 mL/min (ref >60)
GLUCOSE: 115 mg/dL — AB (ref 65–99)
Potassium: 3.5 mmol/L (ref 3.5–5.1)
SODIUM: 134 mmol/L — AB (ref 135–145)
TOTAL PROTEIN: 7.8 g/dL (ref 6.5–8.1)

## 2017-12-31 LAB — CBC
HEMATOCRIT: 38.9 % (ref 35.0–47.0)
HEMOGLOBIN: 12.8 g/dL (ref 12.0–16.0)
MCH: 27 pg (ref 26.0–34.0)
MCHC: 32.8 g/dL (ref 32.0–36.0)
MCV: 82.4 fL (ref 80.0–100.0)
Platelets: 221 10*3/uL (ref 150–440)
RBC: 4.73 MIL/uL (ref 3.80–5.20)
RDW: 13.7 % (ref 11.5–14.5)
WBC: 5.9 10*3/uL (ref 3.6–11.0)

## 2017-12-31 LAB — TROPONIN I: Troponin I: <0.03 ng/mL (ref <0.03)

## 2017-12-31 MED ORDER — AZITHROMYCIN 250 MG PO TABS: ORAL_TABLET | ORAL | 0 refills | Status: AC

## 2017-12-31 NOTE — ED Triage Notes (Addendum)
Awoke during night with palpitations. Chest pain and dizziness today. States currently has chest pain. States has had cough x 1 month. Also has had ongoing periods for about 3 months.

## 2017-12-31 NOTE — ED Provider Notes (Signed)
[facility] Emergency Department Provider Note  Time seen: 7:21 PM  I have reviewed the triage vital signs and the nursing notes.   HISTORY  Chief Complaint Palpitations    HPI Jordan [name] is a 32 y.o. female with a past medical history of anemia, anxiety, palpitations, heart murmur, presents to the emergency department for palpitations and chest pain.  According to the patient overnight last night she was feeling palpitations feeling her heart skip around.  This morning she awoke with pain in her left chest somewhat worse with movement, goes to the left shoulder.  Patient states for the past 2-3 weeks she has been sick with cough and congestion as well as sinus congestion.  Went to her doctor and was prescribed doxycycline but it caused her to be very nauseated so she only took 1 or 2 days worth and discontinued.  Denies any recent fevers.  Largely negative review of systems otherwise.   Past Medical History:  Diagnosis Date  . Anemia   . Anxiety   . Asthma    childhood asthma  . Headache   . Heart murmur 32 years old  . Interstitial cystitis   . Interstitial cystitis 32 years old  . Placenta previa   . Restless leg syndrome     Patient Active Problem List   Diagnosis Date Noted  . Ectopic pregnancy 05/17/2016  . Left lateral abdominal pain 05/17/2016  . Post-operative state 02/14/2016  . First trimester screening 08/23/2015    Past Surgical History:  Procedure Laterality Date  . CESAREAN SECTION  2011/2013  . CESAREAN SECTION WITH BILATERAL TUBAL LIGATION Bilateral 02/14/2016   Procedure: CESAREAN SECTION;  Surgeon: Suzy Bouchardhomas J Schermerhorn, MD;  Location: [facility] ORS;  Service: Obstetrics;  Laterality: Bilateral;  . DILATION AND CURETTAGE OF UTERUS    . DILATION AND CURETTAGE OF UTERUS N/A 06/18/2016   Procedure: DILATATION AND CURETTAGE;  Surgeon: Christeen DouglasBethany Beasley, MD;  Location: [facility] ORS;  Service: Gynecology;  Laterality: N/A;    Prior to  Admission medications   Medication Sig Start Date End Date Taking? Authorizing Provider  docusate sodium (COLACE) 100 MG capsule Take 1 capsule (100 mg total) by mouth 2 (two) times daily as needed. 06/18/16   Christeen DouglasBeasley, Bethany, MD  ferrous sulfate (FERROUSUL) 325 (65 FE) MG tablet Take 1 tablet (325 mg total) by mouth 2 (two) times daily. 05/18/16   Christeen DouglasBeasley, Bethany, MD  ibuprofen (ADVIL,MOTRIN) 800 MG tablet Take 1 tablet (800 mg total) by mouth every 8 (eight) hours as needed. 06/18/16   Christeen DouglasBeasley, Bethany, MD  naproxen (NAPROSYN) 500 MG tablet Take 1 tablet (500 mg total) by mouth 2 (two) times daily with a meal. 04/23/17   Irean HongSung, Jade J, MD  norgestimate-ethinyl estradiol (ORTHO-CYCLEN,SPRINTEC,PREVIFEM) 0.25-35 MG-MCG tablet Take 1 tablet by mouth daily. 05/18/16   Christeen DouglasBeasley, Bethany, MD  ondansetron (ZOFRAN ODT) 4 MG disintegrating tablet Take 1 tablet (4 mg total) by mouth every 6 (six) hours as needed for nausea. 06/18/16   Christeen DouglasBeasley, Bethany, MD    Allergies  Allergen Reactions  . Vicodin [Hydrocodone-Acetaminophen] Swelling  . Amoxicillin Swelling and Rash    Reaction unknown    No family history on file.  Social History Social History   Tobacco Use  . Smoking status: Current Every Day Smoker    Packs/day: 1.00    Types: Cigarettes  . Smokeless tobacco: Never Used  Substance Use Topics  . Alcohol use: No  . Drug use: No    Review  of Systems Constitutional: Negative for fever. Eyes: Negative for visual complaints ENT: Positive for congestion Cardiovascular: Mild left chest pain worse with cough. Respiratory: Negative for shortness of breath.  Positive for cough intermittent over the past 2-3 weeks Gastrointestinal: Negative for abdominal pain, vomiting Genitourinary: Negative for urinary compaints Musculoskeletal: Negative for leg pain or swelling. Skin: Negative for skin complaints  Neurological: Negative for headache All other ROS  negative  ____________________________________________   PHYSICAL EXAM:  VITAL SIGNS: ED Triage Vitals  Enc Vitals Group     BP 12/31/17 1602 (!) 104/45     Pulse Rate 12/31/17 1602 74     Resp 12/31/17 1602 20     Temp 12/31/17 1602 98.3 F (36.8 C)     Temp Source 12/31/17 1602 Oral     SpO2 12/31/17 1602 100 %     Weight 12/31/17 1604 103 lb (46.7 kg)     Height 12/31/17 1604 5\' 4"  (1.626 m)     Head Circumference --      Peak Flow --      Pain Score 12/31/17 1604 3     Pain Loc --      Pain Edu? --      Excl. in GC? --    Constitutional: Alert and oriented. Well appearing and in no distress. Eyes: Normal exam ENT   Head: Normocephalic and atraumatic.   Mouth/Throat: Mucous membranes are moist. Cardiovascular: Normal rate, regular rhythm. No murmur Respiratory: Normal respiratory effort without tachypnea nor retractions. Breath sounds are clear.  Mild to moderate left chest tenderness. Gastrointestinal: Soft and nontender. No distention. Musculoskeletal: Nontender with normal range of motion in all extremities. No lower extremity tenderness or edema. Neurologic:  Normal speech and language. No gross focal neurologic deficits are appreciated. Skin:  Skin is warm, dry and intact.  Psychiatric: Mood and affect are normal.  ____________________________________________    EKG  EKG reviewed and interpreted by myself shows normal sinus rhythm at 72 bpm with a narrow QRS, normal axis, normal intervals, no concerning ST changes.  ____________________________________________    RADIOLOGY  Chest x-ray shows bronchitic changes.  ____________________________________________   INITIAL IMPRESSION / ASSESSMENT AND PLAN / ED COURSE  Pertinent labs & imaging results that were available during my care of the patient were reviewed by me and considered in my medical decision making (see chart for details).  Patient presents to the emergency department for left chest  discomfort worse with coughing since this morning.  Patient states she has been coughing over the past 2-3 weeks with moderate sinus congestion.  Differential would include chest wall pain, muscle strain, less likely ACS, pneumonia, pneumothorax.  Chest x-ray consistent with bronchitic changes.  Given the patient's ongoing cough for the past several weeks along with congestion I believe the patient's chest discomfort is likely musculoskeletal/chest wall in nature or possibly due to pleurisy.  Given the bronchitic changes on the x-ray and continued cough we will prescribe Zithromax for possible bronchitis and have the patient follow-up with her doctor.  Reassuringly the patient's EKG shows no acute findings, mild electrical interference with good sinus waves throughout lead V5.  Labs including cardiac enzymes are normal.  I did discuss following up with a cardiologist at some point in the near future for further evaluation given the patient's frequent palpitations she reports over the past several years.  ____________________________________________   FINAL CLINICAL IMPRESSION(S) / ED DIAGNOSES  Palpitations Bronchitis    Minna Antis, MD 12/31/17 1924

## 2018-06-12 IMAGING — US US OB TRANSVAGINAL
1 series · 13 of 28 positions shown · non-contrast
Comparison: None for this pregnancy

CLINICAL DATA: 30-year-old female with pelvic pain and vaginal
bleeding

EXAM:
OBSTETRIC <14 WK US AND TRANSVAGINAL OB US
TECHNIQUE: Both transabdominal and transvaginal ultrasound examinations were
performed for complete evaluation of the gestation as well as the
maternal uterus, adnexal regions, and pelvic cul-de-sac.
Transvaginal technique was performed to assess early pregnancy.

[Series 1: us ob transvaginal · 0.19mm/px · 13 of 51 slices shown]
[im 2/51]
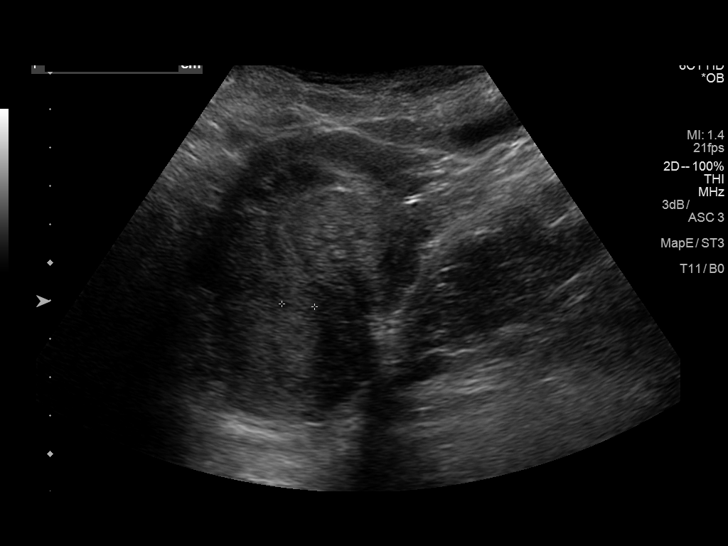
[im 6/51]
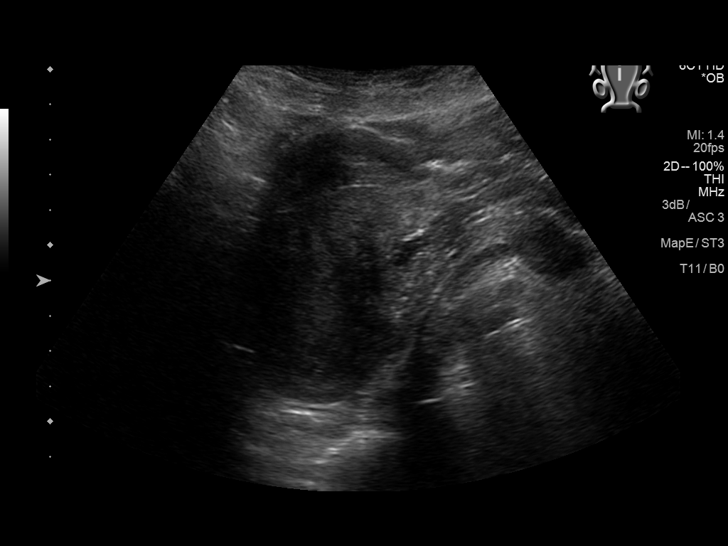
[im 10/51]
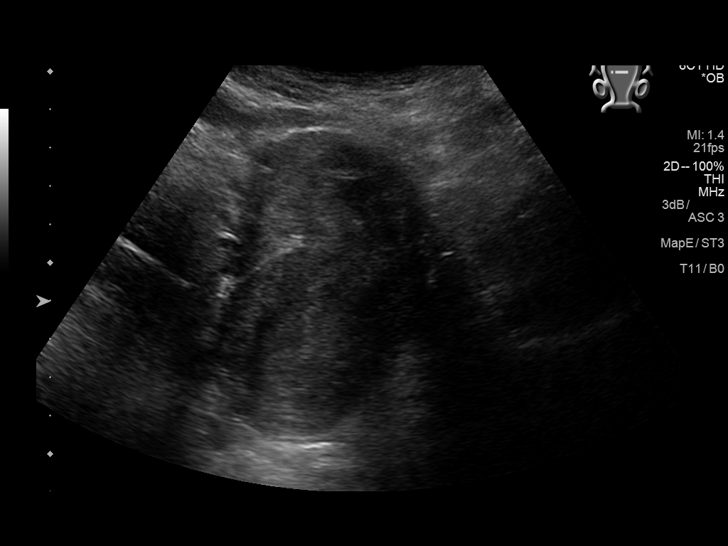
[im 13/51]
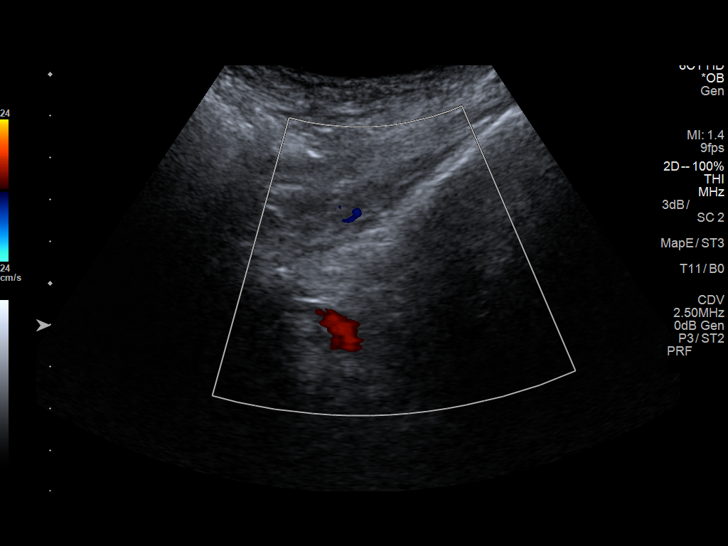
[im 17/51]
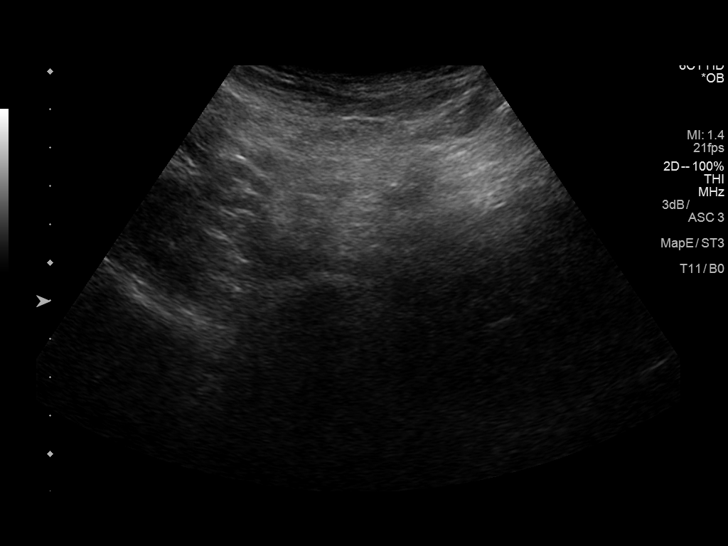
[im 21/51]
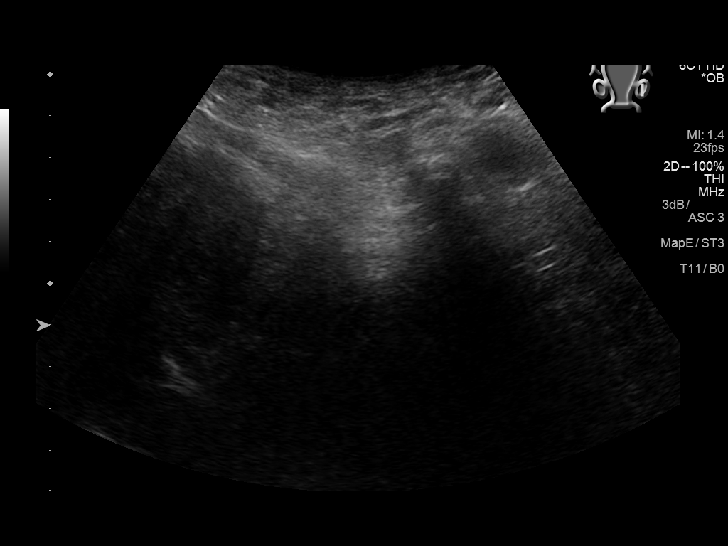
[im 26/51]
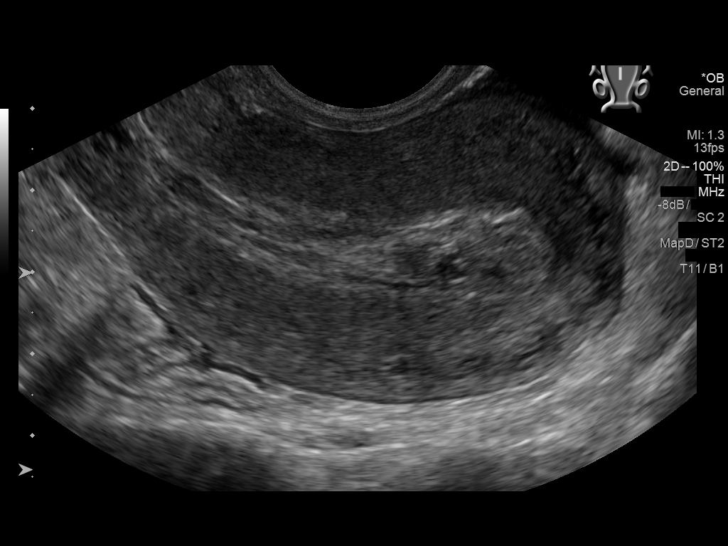
[im 30/51]
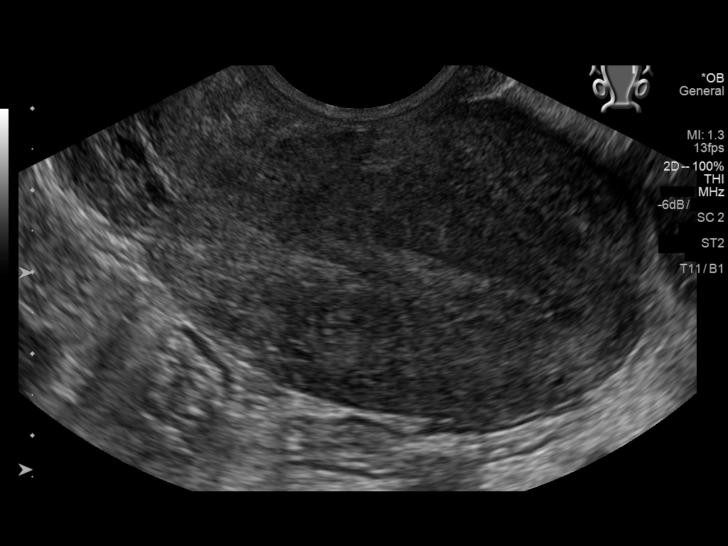
[im 34/51]
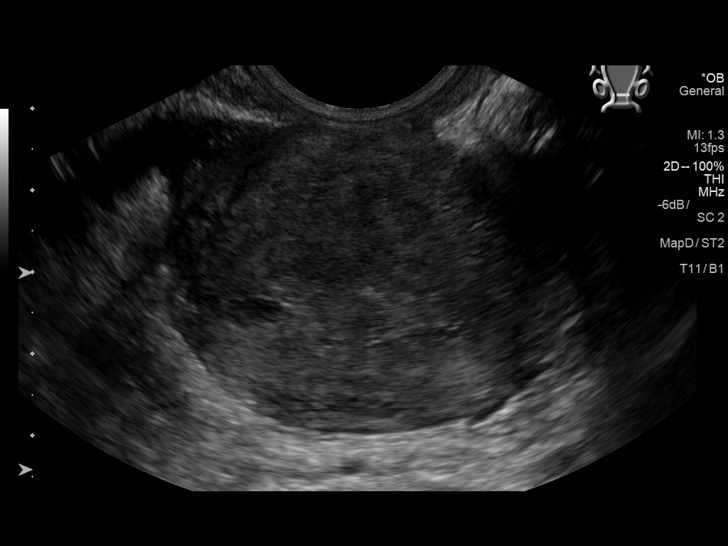
[im 38/51]
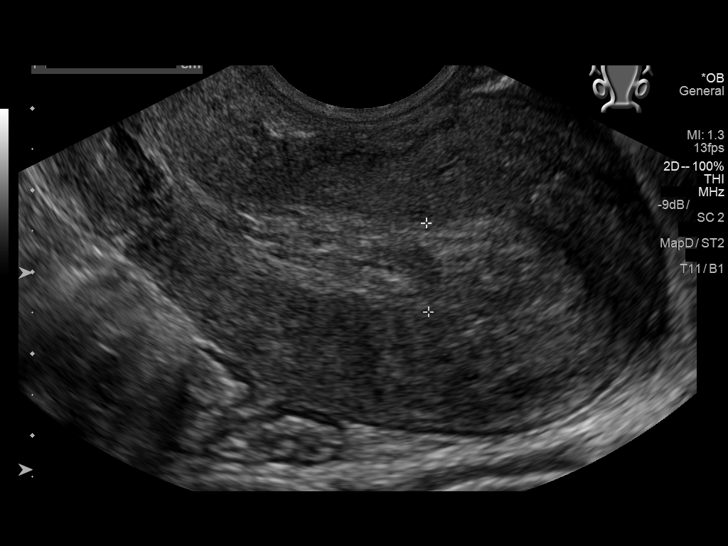
[im 41/51]
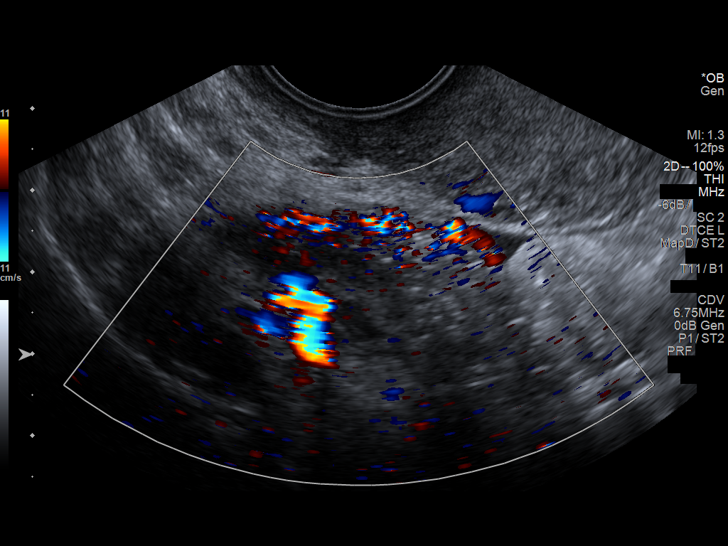
[im 45/51]
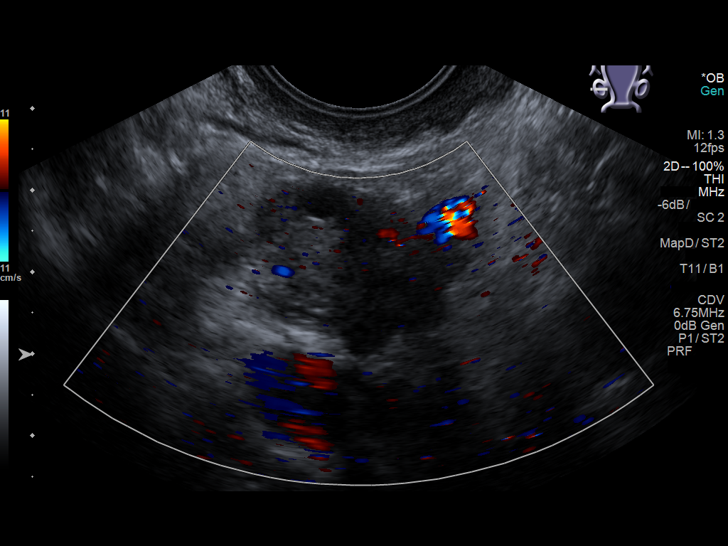
[im 49/51]
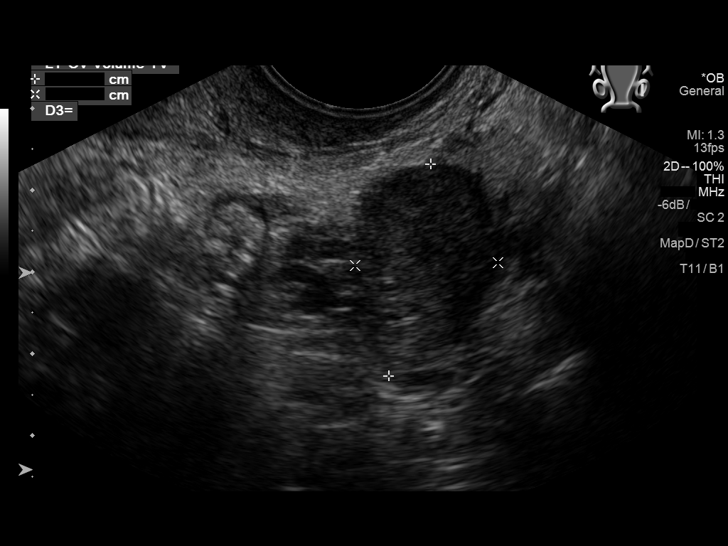

[13 of 28 positions shown; findings below may reference images not displayed]

FINDINGS: The uterus is retroverted and appears heterogeneous. No intrauterine
pregnancy identified. The endometrium is thickened measuring 11 mm
in thickness. Echogenic endometrial content with no internal
vascularity most likely represent blood product. Trace fluid noted
in the endocervical canal.

Please note with positive HCG levels and in the absence of
documented IUP by ultrasound, the possibility of an ectopic
pregnancy is not entirely excluded. Correlation with clinical exam
and follow-up with serial HCG levels and ultrasound recommended.

The right ovary measures 3.6 x 2.0 x 2.3 cm and the left ovary
measures 2.6 x 1.8 x 2.1 cm. The ovaries appear unremarkable.

No free fluid identified within the pelvis.
IMPRESSION: No intrauterine pregnancy identified. Correlation with clinical exam
and follow-up with serial HCG levels and ultrasound recommended.

Thickened endometrium containing blood product.

## 2018-12-19 IMAGING — CR DG CHEST 2V
2 series · 2 of 2 positions shown · non-contrast
Comparison: Chest x-ray dated 11/04/2011 and CT scan of the chest
dated 04/23/2017

CLINICAL DATA: Chest and left arm pain and dizziness.  Cough.

EXAM:
CHEST  2 VIEW

[chest pa]
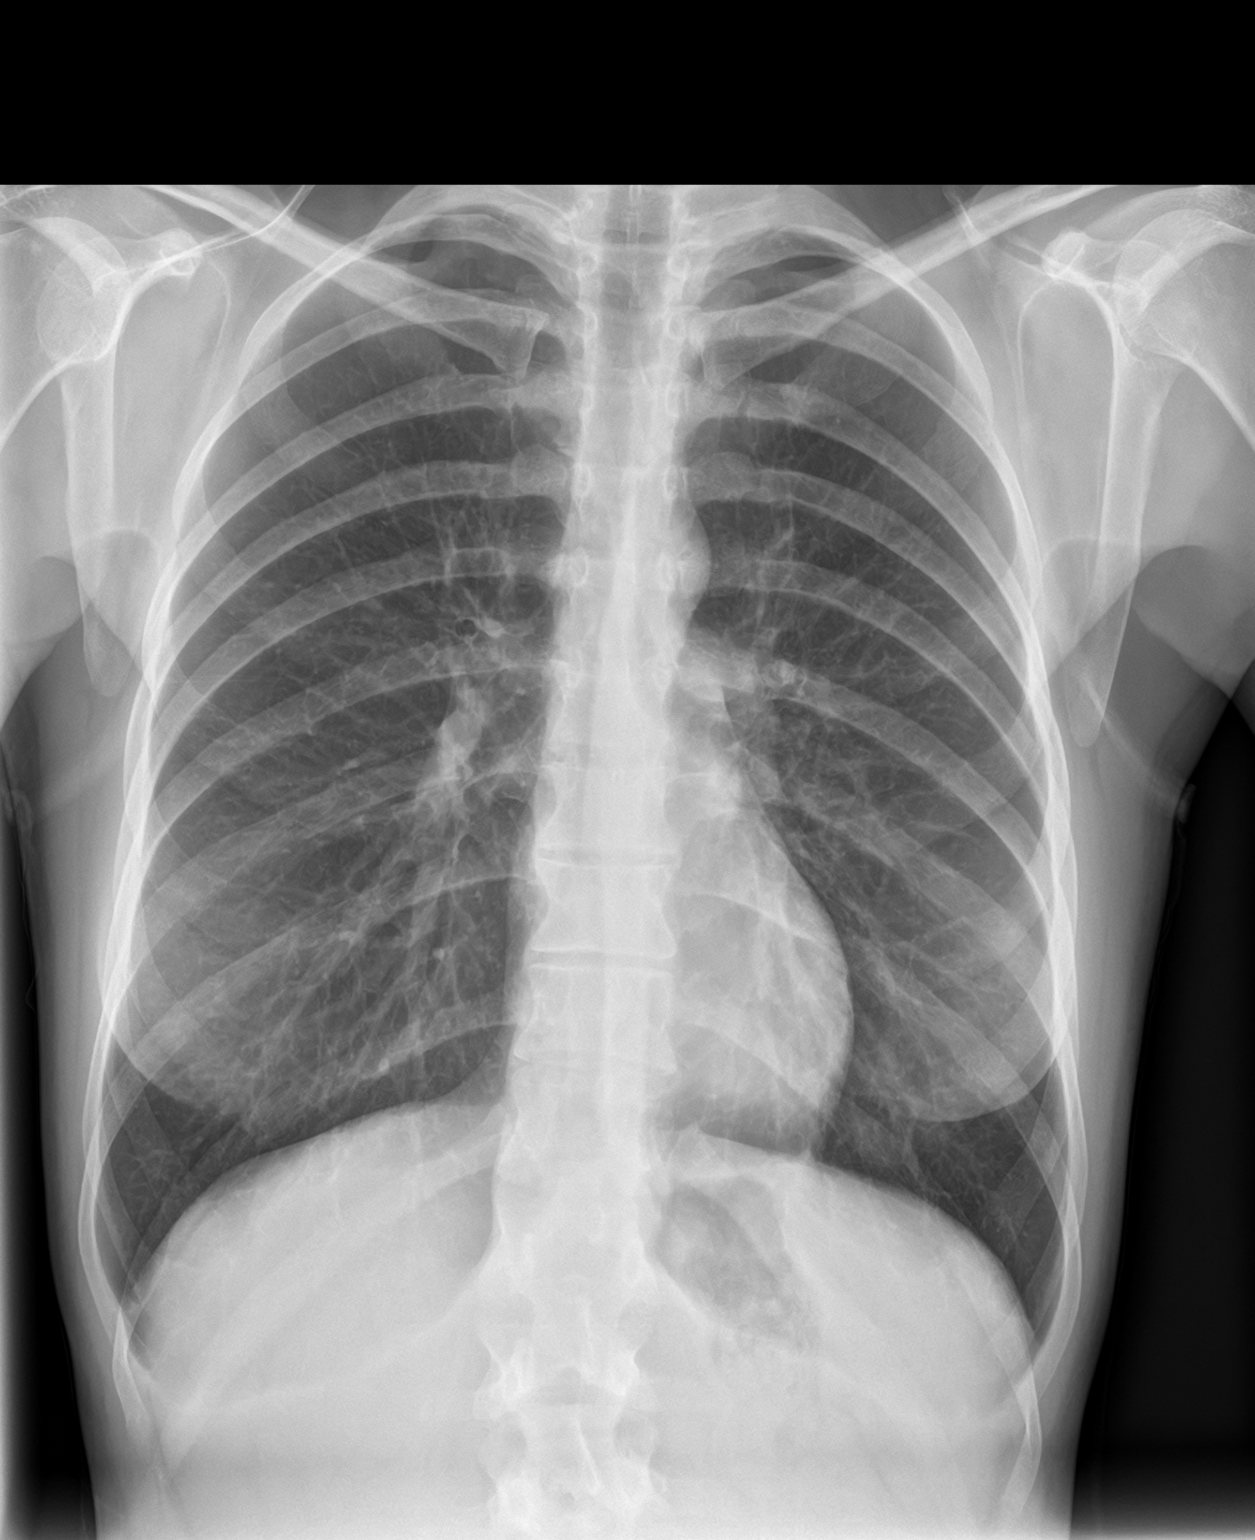

[chest lat]
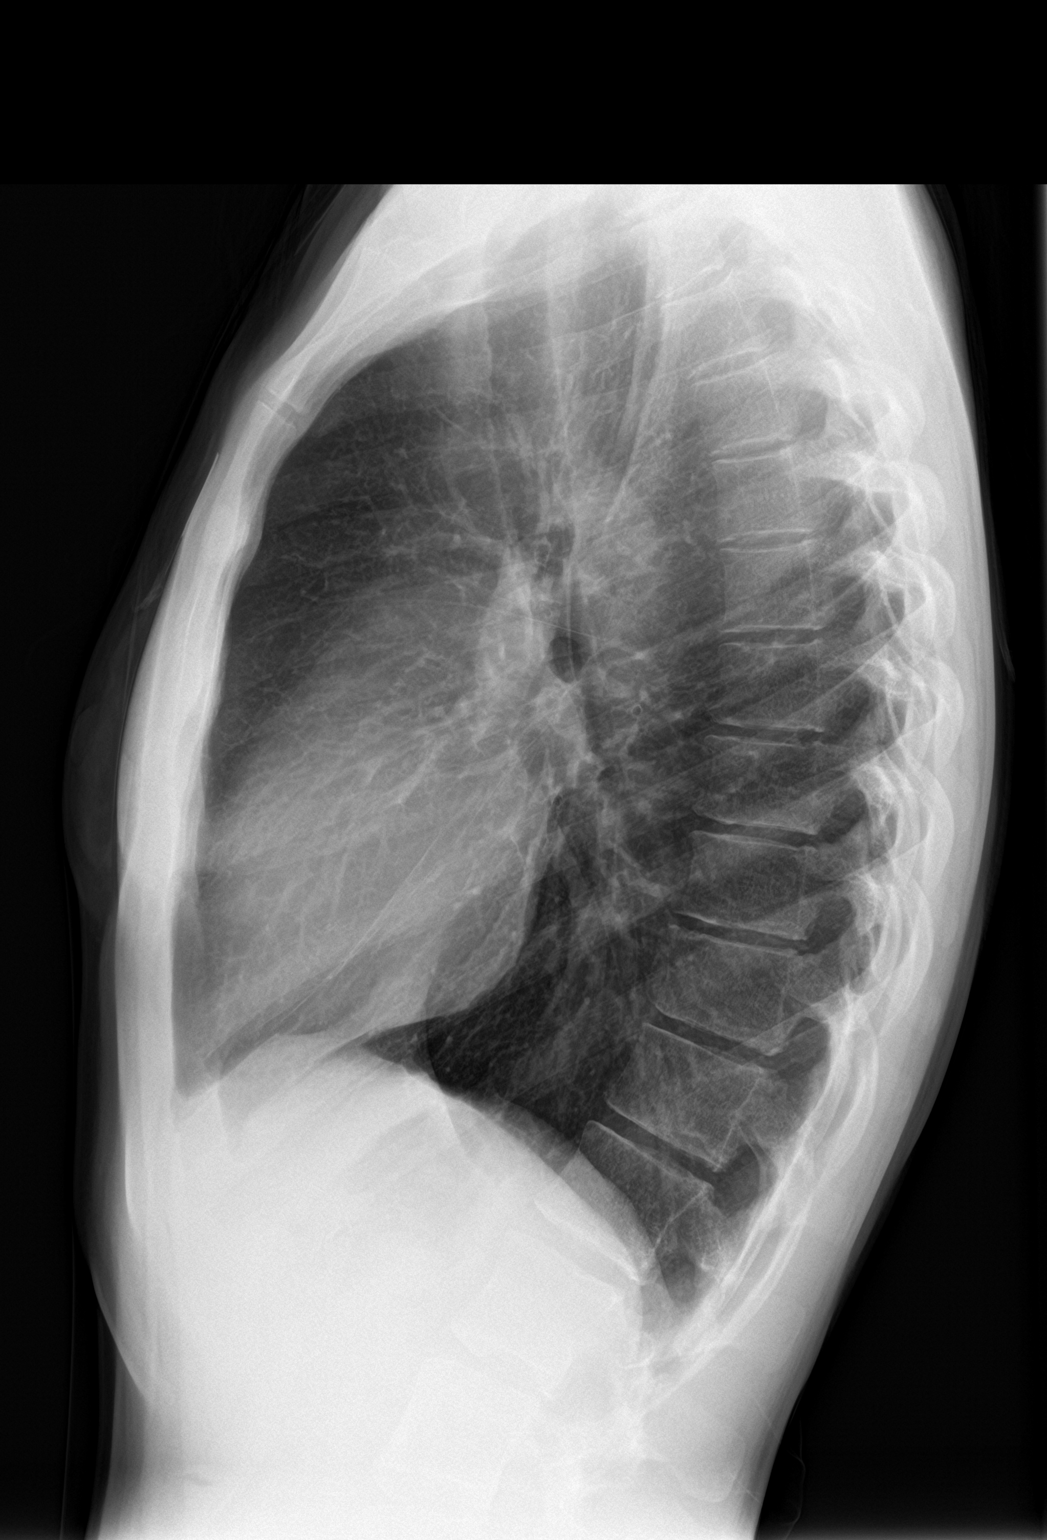

[2 of 2 positions shown; findings below may reference images not displayed]

FINDINGS: The heart size and mediastinal contours are within normal limits.
Both lungs are clear except for slight peribronchial thickening. No
effusions. The visualized skeletal structures are unremarkable.
IMPRESSION: Mild bronchitic changes.

## 2019-05-03 ENCOUNTER — Other Ambulatory Visit: Payer: Self-pay

## 2019-05-03 ENCOUNTER — Emergency Department: Payer: Medicaid Other

## 2019-05-03 ENCOUNTER — Emergency Department
Admission: EM | Admit: 2019-05-03 | Discharge: 2019-05-03 | Disposition: A | Discharge status: Home / Self Care | Payer: Medicaid Other | Attending: Student in an Organized Health Care Education/Training Program | Admitting: Student in an Organized Health Care Education/Training Program

## 2019-05-03 DIAGNOSIS — F17210 Nicotine dependence, cigarettes, uncomplicated: Secondary | ICD-10-CM | POA: Diagnosis not present

## 2019-05-03 DIAGNOSIS — J45909 Unspecified asthma, uncomplicated: Secondary | ICD-10-CM | POA: Diagnosis not present

## 2019-05-03 DIAGNOSIS — J069 Acute upper respiratory infection, unspecified: Secondary | ICD-10-CM | POA: Insufficient documentation

## 2019-05-03 DIAGNOSIS — R0602 Shortness of breath: Secondary | ICD-10-CM | POA: Diagnosis present

## 2019-05-03 DIAGNOSIS — Z20828 Contact with and (suspected) exposure to other viral communicable diseases: Secondary | ICD-10-CM | POA: Insufficient documentation

## 2019-05-03 LAB — URINALYSIS, COMPLETE (UACMP) WITH MICROSCOPIC
Bilirubin Urine: NEGATIVE
Glucose, UA: NEGATIVE mg/dL
Hgb urine dipstick: NEGATIVE
Ketones, ur: NEGATIVE mg/dL
Leukocytes,Ua: NEGATIVE
Nitrite: NEGATIVE
Protein, ur: NEGATIVE mg/dL
Specific Gravity, Urine: 1.024 (ref 1.005–1.030)
pH: 5 (ref 5.0–8.0)

## 2019-05-03 LAB — COMPREHENSIVE METABOLIC PANEL
ALT: 23 U/L (ref 0–44)
AST: 35 U/L (ref 15–41)
Albumin: 4.5 g/dL (ref 3.5–5.0)
Alkaline Phosphatase: 75 U/L (ref 38–126)
Anion gap: 10 (ref 5–15)
BUN: 9 mg/dL (ref 6–20)
CO2: 23 mmol/L (ref 22–32)
Calcium: 9.5 mg/dL (ref 8.9–10.3)
Chloride: 106 mmol/L (ref 98–111)
Creatinine, Ser: 0.56 mg/dL (ref 0.44–1.00)
GFR calc Af Amer: >60 mL/min (ref >60)
GFR calc non Af Amer: >60 mL/min (ref >60)
Glucose, Bld: 103 mg/dL — ABNORMAL HIGH (ref 70–99)
Potassium: 3.8 mmol/L (ref 3.5–5.1)
Sodium: 139 mmol/L (ref 135–145)
Total Bilirubin: 0.5 mg/dL (ref 0.3–1.2)
Total Protein: 8.2 g/dL — ABNORMAL HIGH (ref 6.5–8.1)

## 2019-05-03 LAB — CBC
HCT: 41.4 % (ref 36.0–46.0)
Hemoglobin: 13.5 g/dL (ref 12.0–15.0)
MCH: 27.5 pg (ref 26.0–34.0)
MCHC: 32.6 g/dL (ref 30.0–36.0)
MCV: 84.3 fL (ref 80.0–100.0)
Platelets: 254 10*3/uL (ref 150–400)
RBC: 4.91 MIL/uL (ref 3.87–5.11)
RDW: 13.2 % (ref 11.5–15.5)
WBC: 11 10*3/uL — ABNORMAL HIGH (ref 4.0–10.5)
nRBC: 0 % (ref 0.0–0.2)

## 2019-05-03 LAB — LIPASE, BLOOD: Lipase: 27 U/L (ref 11–51)

## 2019-05-03 LAB — GROUP A STREP BY PCR: Group A Strep by PCR: NOT DETECTED

## 2019-05-03 MED ORDER — ALBUTEROL SULFATE HFA 108 (90 BASE) MCG/ACT IN AERS
2.0000 | INHALATION_SPRAY | Freq: Once | RESPIRATORY_TRACT | Status: DC
  Filled 2019-05-03: qty 6.7

## 2019-05-03 MED ORDER — DEXAMETHASONE 4 MG PO TABS
6.0000 mg | ORAL_TABLET | Freq: Once | ORAL | Status: AC
  Administered 2019-05-03: 15:00:00 6 mg via ORAL
  Filled 2019-05-03: qty 1.5

## 2019-05-03 MED ORDER — ALBUTEROL SULFATE HFA 108 (90 BASE) MCG/ACT IN AERS: 2.0000 | INHALATION_SPRAY | Freq: Four times a day (QID) | RESPIRATORY_TRACT | 0 refills | Status: AC | PRN

## 2019-05-03 NOTE — ED Notes (Signed)
reports feeling sick like 4 fays, describes sick as dizziness, headche, sore throat and running nose with congestion, also reports lump on the side of left leg that appears hot and painful at times. Reports lump x 2 days.

## 2019-05-03 NOTE — ED Triage Notes (Addendum)
Pt arrived via POV with reports of nasal congestion, shortness of breath, cough and sore throat. Pt denies any exposure to COVID, however, pt has family member that works at a [employer] where a co-worker tested positive and pt has been around that family member.   Pt unsure of fevers, but states she has had the chills.   Pt also c/o LLQ abdominal pain.  Pt states sxs started 3-4 days ago.

## 2019-05-03 NOTE — ED Provider Notes (Signed)
[facility] Emergency Department Provider Note    First MD Initiated Contact with Patient 05/03/19 1233     (approximate)  I have reviewed the triage vital signs and the nursing notes.   HISTORY  Chief Complaint Shortness of Breath, Nasal Congestion, and Abdominal Pain    HPI Jordan [name] is a 33 y.o. female below listed past medical history presents the ER for evaluation of cough sore throat muscle aches brief episode of left lower quadrant abdominal pain and a knot to the left lateral knee.  Denies any tick bites.  No measured fevers.  Did feel cold this morning.  No chills.  Is worried she may have contracted COVID-19.  Does smoke.  Denies any history of lung disease.    Past Medical History:  Diagnosis Date   Anemia    Anxiety    Asthma    childhood asthma   Headache    Heart murmur 33 years old   Interstitial cystitis    Interstitial cystitis 33 years old   Placenta previa    Restless leg syndrome    No family history on file. Past Surgical History:  Procedure Laterality Date   CESAREAN SECTION  2011/2013   CESAREAN SECTION WITH BILATERAL TUBAL LIGATION Bilateral 02/14/2016   Procedure: CESAREAN SECTION;  Surgeon: Boykin Nearing, MD;  Location: [facility] ORS;  Service: Obstetrics;  Laterality: Bilateral;   DILATION AND CURETTAGE OF UTERUS     DILATION AND CURETTAGE OF UTERUS N/A 06/18/2016   Procedure: DILATATION AND CURETTAGE;  Surgeon: Benjaman Kindler, MD;  Location: [facility] ORS;  Service: Gynecology;  Laterality: N/A;   Patient Active Problem List   Diagnosis Date Noted   Ectopic pregnancy 05/17/2016   Left lateral abdominal pain 05/17/2016   Post-operative state 02/14/2016   First trimester screening 08/23/2015      Prior to Admission medications   Medication Sig Start Date End Date Taking? Authorizing Provider  nicotine polacrilex (NICORETTE) 2 MG gum Take 2 mg by mouth as needed for smoking cessation.   Yes  [provider]  Prenatal Vit-Fe Fumarate-FA (MULTIVITAMIN-PRENATAL) 27-0.8 MG TABS tablet Take 1 tablet by mouth daily at 12 noon.   Yes [provider]  albuterol (VENTOLIN HFA) 108 (90 Base) MCG/ACT inhaler Inhale 2 puffs into the lungs every 6 (six) hours as needed for wheezing or shortness of breath. 05/03/19   Merlyn Lot, MD    Allergies Amoxicillin, Hydrocodone-acetaminophen, and Vicodin [hydrocodone-acetaminophen]    Social History Social History   Tobacco Use   Smoking status: Current Every Day Smoker    Packs/day: 1.00    Types: Cigarettes   Smokeless tobacco: Never Used  Substance Use Topics   Alcohol use: No   Drug use: No    Review of Systems Patient denies headaches, rhinorrhea, blurry vision, numbness, shortness of breath, chest pain, edema, cough, abdominal pain, nausea, vomiting, diarrhea, dysuria, fevers, rashes or hallucinations unless otherwise stated above in HPI. ____________________________________________   PHYSICAL EXAM:  VITAL SIGNS: Vitals:   05/03/19 1051 05/03/19 1325  BP: 107/70 118/74  Pulse: 77 74  Resp: 16 17  Temp: 98.9 F (37.2 C)   SpO2: 98% 98%    Constitutional: Alert and oriented.  Eyes: Conjunctivae are normal.  Head: Atraumatic. Nose: No congestion/rhinnorhea. Mouth/Throat: Mucous membranes are moist.  Bilateral pharyngeal erythema without exudates.  No PTA.  Uvula midline. Neck: No stridor. Painless ROM.  Cardiovascular: Normal rate, regular rhythm. Grossly normal heart sounds.  Good peripheral  circulation. Respiratory: Normal respiratory effort.  No retractions. Lungs CTAB. Gastrointestinal: Soft and nontender. No distention. No abdominal bruits. No CVA tenderness. Genitourinary:  Musculoskeletal: No lower extremity tenderness nor edema.  Less than 1 cm mobile nodule proximal to the left knee on the lateral aspect.  No overlying cellulitis.  No fluctuance.  No joint effusions. Neurologic:   Normal speech and language. No gross focal neurologic deficits are appreciated. No facial droop Skin:  Skin is warm, dry and intact. No rash noted. Psychiatric: Mood and affect are normal. Speech and behavior are normal.  ____________________________________________   LABS (all labs ordered are listed, but only abnormal results are displayed)  Results for orders placed or performed during the hospital encounter of 05/03/19 (from the past 24 hour(s))  Lipase, blood     Status: None   Collection Time: 05/03/19 10:55 AM  Result Value Ref Range   Lipase 27 11 - 51 U/L  Comprehensive metabolic panel     Status: Abnormal   Collection Time: 05/03/19 10:55 AM  Result Value Ref Range   Sodium 139 135 - 145 mmol/L   Potassium 3.8 3.5 - 5.1 mmol/L   Chloride 106 98 - 111 mmol/L   CO2 23 22 - 32 mmol/L   Glucose, Bld 103 (H) 70 - 99 mg/dL   BUN 9 6 - 20 mg/dL   Creatinine, Ser 1.610.56 0.44 - 1.00 mg/dL   Calcium 9.5 8.9 - 09.610.3 mg/dL   Total Protein 8.2 (H) 6.5 - 8.1 g/dL   Albumin 4.5 3.5 - 5.0 g/dL   AST 35 15 - 41 U/L   ALT 23 0 - 44 U/L   Alkaline Phosphatase 75 38 - 126 U/L   Total Bilirubin 0.5 0.3 - 1.2 mg/dL   GFR calc non Af Amer >60 >60 mL/min   GFR calc Af Amer >60 >60 mL/min   Anion gap 10 5 - 15  CBC     Status: Abnormal   Collection Time: 05/03/19 10:55 AM  Result Value Ref Range   WBC 11.0 (H) 4.0 - 10.5 K/uL   RBC 4.91 3.87 - 5.11 MIL/uL   Hemoglobin 13.5 12.0 - 15.0 g/dL   HCT 04.541.4 40.936.0 - 81.146.0 %   MCV 84.3 80.0 - 100.0 fL   MCH 27.5 26.0 - 34.0 pg   MCHC 32.6 30.0 - 36.0 g/dL   RDW 91.413.2 78.211.5 - 95.615.5 %   Platelets 254 150 - 400 K/uL   nRBC 0.0 0.0 - 0.2 %  Urinalysis, Complete w Microscopic     Status: Abnormal   Collection Time: 05/03/19 10:55 AM  Result Value Ref Range   Color, Urine YELLOW (A) YELLOW   APPearance HAZY (A) CLEAR   Specific Gravity, Urine 1.024 1.005 - 1.030   pH 5.0 5.0 - 8.0   Glucose, UA NEGATIVE NEGATIVE mg/dL   Hgb urine dipstick NEGATIVE  NEGATIVE   Bilirubin Urine NEGATIVE NEGATIVE   Ketones, ur NEGATIVE NEGATIVE mg/dL   Protein, ur NEGATIVE NEGATIVE mg/dL   Nitrite NEGATIVE NEGATIVE   Leukocytes,Ua NEGATIVE NEGATIVE   RBC / HPF 0-5 0 - 5 RBC/hpf   WBC, UA 0-5 0 - 5 WBC/hpf   Bacteria, UA RARE (A) NONE SEEN   Squamous Epithelial / LPF 0-5 0 - 5   Mucus PRESENT    Sperm, UA PRESENT   Group A Strep by PCR ([facility] Only)     Status: None   Collection Time: 05/03/19  1:23 PM   Specimen: Throat;  Sterile Swab  Result Value Ref Range   Group A Strep by PCR NOT DETECTED NOT DETECTED   ____________________________________________  EKG My review and personal interpretation at Time: 10:50   Indication: cough  Rate: 70  Rhythm: sinus Axis: normal Other: nonspeicic st abn and t wave inversion in V3 that is unchanged from previous tracings ____________________________________________  RADIOLOGY  I personally reviewed all radiographic images ordered to evaluate for the above acute complaints and reviewed radiology reports and findings.  These findings were personally discussed with the patient.  Please see medical record for radiology report.  ____________________________________________   PROCEDURES  Procedure(s) performed:  Procedures    Critical Care performed: no ____________________________________________   INITIAL IMPRESSION / ASSESSMENT AND PLAN / ED COURSE  Pertinent labs & imaging results that were available during my care of the patient were reviewed by me and considered in my medical decision making (see chart for details).   DDX: URI, strep, COVID, pneumonia, abscess, dehydration, electrolyte abnormality, UTI, stone, torsion  Jordan [name] is a 33 y.o. who presents to the ED with symptoms as described above.  Patient well-appearing in no acute distress.  Seems to be having symptoms primarily related to URI.  Blood work with mild leukocytosis otherwise reassuring.  No evidence of UTI or hematuria.   Abdominal exam is soft and benign.  Not consistent with TOA or ovarian pathology.  Not consistent with colitis.  No evidence of strep.  Have checked for COVID.  She is not hypoxic and given remainder of her work-up and examination being fairly reassuring do feel that she is appropriate for trial of outpatient management.  Will treat for bronchitis with albuterol and Decadron.  Discussed signs and symptoms which she should return to the ER.    The patient was evaluated in Emergency Department today for the symptoms described in the history of present illness. He/she was evaluated in the context of the global COVID-19 pandemic, which necessitated consideration that the patient might be at risk for infection with the SARS-CoV-2 virus that causes COVID-19. Institutional protocols and algorithms that pertain to the evaluation of patients at risk for COVID-19 are in a state of rapid change based on information released by regulatory bodies including the CDC and federal and state organizations. These policies and algorithms were followed during the patient's care in the ED.  As part of my medical decision making, I reviewed the following data within the electronic MEDICAL RECORD NUMBER Nursing notes reviewed and incorporated, Labs reviewed, notes from prior ED visits and [place] Controlled Substance Database   ____________________________________________   FINAL CLINICAL IMPRESSION(S) / ED DIAGNOSES  Final diagnoses:  Upper respiratory tract infection, unspecified type      NEW MEDICATIONS STARTED DURING THIS VISIT:  New Prescriptions   ALBUTEROL (VENTOLIN HFA) 108 (90 BASE) MCG/ACT INHALER    Inhale 2 puffs into the lungs every 6 (six) hours as needed for wheezing or shortness of breath.     Note:  This document was prepared using Dragon voice recognition software and may include unintentional dictation errors.    Willy Eddyobinson, Bee Marchiano, MD 05/03/19 [phone]

## 2019-05-04 LAB — NOVEL CORONAVIRUS, NAA (HOSP ORDER, SEND-OUT TO REF LAB; TAT 18-24 HRS): SARS-CoV-2, NAA: NOT DETECTED

## 2020-04-20 IMAGING — DX PORTABLE CHEST - 1 VIEW
1 series · 1 of 1 positions shown · non-contrast
Comparison: December 31, 2017

CLINICAL DATA: Shortness of breath and cough

EXAM:
PORTABLE CHEST 1 VIEW

[chest ap]
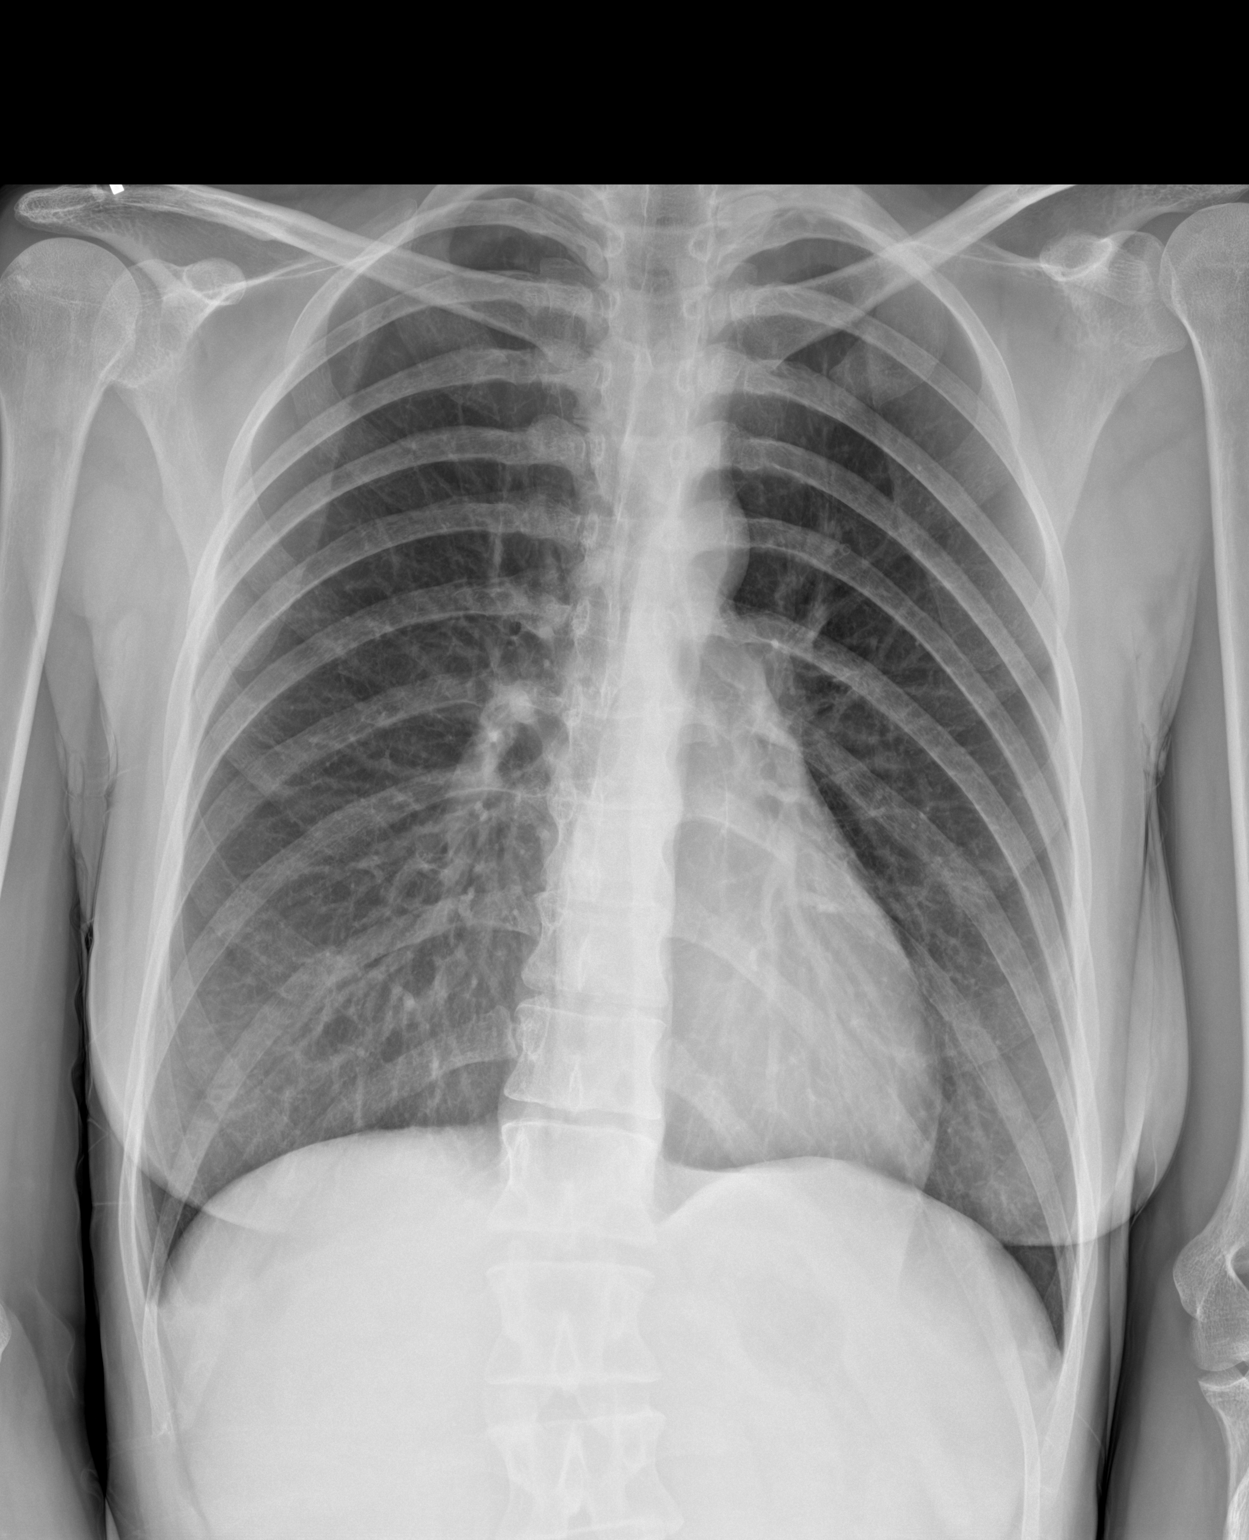

[1 of 1 positions shown; findings below may reference images not displayed]

FINDINGS: Lungs are clear. Heart size and pulmonary vascularity are normal. No
adenopathy. No bone lesions.
IMPRESSION: No edema or consolidation.

## 2023-09-30 ENCOUNTER — Emergency Department
Admission: EM | Admit: 2023-09-30 | Discharge: 2023-10-01 | Disposition: A | Discharge status: Home / Self Care | Payer: Medicaid Other | Attending: Emergency Medicine | Admitting: Emergency Medicine

## 2023-09-30 ENCOUNTER — Other Ambulatory Visit: Payer: Self-pay

## 2023-09-30 ENCOUNTER — Encounter: Payer: Self-pay | Admitting: Radiology

## 2023-09-30 ENCOUNTER — Emergency Department: Payer: Medicaid Other

## 2023-09-30 DIAGNOSIS — R09A2 Foreign body sensation, throat: Secondary | ICD-10-CM | POA: Diagnosis not present

## 2023-09-30 DIAGNOSIS — F419 Anxiety disorder, unspecified: Secondary | ICD-10-CM | POA: Diagnosis not present

## 2023-09-30 DIAGNOSIS — R0602 Shortness of breath: Secondary | ICD-10-CM | POA: Diagnosis present

## 2023-09-30 LAB — CBC
HCT: 40 % (ref 36.0–46.0)
Hemoglobin: 13 g/dL (ref 12.0–15.0)
MCH: 28 pg (ref 26.0–34.0)
MCHC: 32.5 g/dL (ref 30.0–36.0)
MCV: 86 fL (ref 80.0–100.0)
Platelets: 269 10*3/uL (ref 150–400)
RBC: 4.65 MIL/uL (ref 3.87–5.11)
RDW: 13.3 % (ref 11.5–15.5)
WBC: 9.1 10*3/uL (ref 4.0–10.5)
nRBC: 0 % (ref 0.0–0.2)

## 2023-09-30 LAB — BASIC METABOLIC PANEL
Anion gap: 9 (ref 5–15)
BUN: 8 mg/dL (ref 6–20)
CO2: 23 mmol/L (ref 22–32)
Calcium: 9 mg/dL (ref 8.9–10.3)
Chloride: 105 mmol/L (ref 98–111)
Creatinine, Ser: 1.22 mg/dL — ABNORMAL HIGH (ref 0.44–1.00)
GFR, Estimated: 59 mL/min — ABNORMAL LOW (ref >60)
Glucose, Bld: 102 mg/dL — ABNORMAL HIGH (ref 70–99)
Potassium: 3.6 mmol/L (ref 3.5–5.1)
Sodium: 137 mmol/L (ref 135–145)

## 2023-09-30 LAB — TROPONIN I (HIGH SENSITIVITY): Troponin I (High Sensitivity): 3 ng/L (ref <18)

## 2023-09-30 LAB — POC URINE PREG, ED: Preg Test, Ur: NEGATIVE

## 2023-09-30 MED ORDER — LIDOCAINE VISCOUS HCL 2 % MT SOLN
15.0000 mL | Freq: Once | OROMUCOSAL | Status: AC
  Administered 2023-09-30: 15 mL via OROMUCOSAL
  Filled 2023-09-30: qty 15

## 2023-09-30 MED ORDER — LORAZEPAM 1 MG PO TABS
1.0000 mg | ORAL_TABLET | Freq: Once | ORAL | Status: AC
  Administered 2023-09-30: 1 mg via ORAL
  Filled 2023-09-30: qty 1

## 2023-09-30 NOTE — ED Triage Notes (Signed)
Pt sts that she has been feeling like her throat is closing up. Pt resp are evan and unlabored. Pt is able to talk in full and complete sentences.

## 2023-09-30 NOTE — ED Provider Notes (Signed)
[facility] Provider Note    Event Date/Time   First MD Initiated Contact with Patient 09/30/23 2309     (approximate)   History   Shortness of Breath   HPI  Jordan [name] is a 37 y.o. female who presents to the emergency department today because of concerns for feeling like her throat is closing, out of body sensation and intermittent crying.  The symptoms started this afternoon.  Patient denies any unusual activity or exertion when the symptoms started.  She felt that on the drive over her throat was closing almost to the point where she thought she would stop breathing.  This has improved.  However she continues to have intermittent episodes of crying.  Patient does have history of anxiety and did wonder if she might be having a panic attack although has never had these specific symptoms before.      Physical Exam   Triage Vital Signs: ED Triage Vitals [09/30/23 2211]  Encounter Vitals Group     BP 121/66     Systolic BP Percentile      Diastolic BP Percentile      Pulse Rate 71     Resp 19     Temp 97.8 F (36.6 C)     Temp Source Oral     SpO2 100 %     Weight 103 lb (46.7 kg)     Height 5\' 4"  (1.626 m)     Head Circumference      Peak Flow      Pain Score 0     Pain Loc      Pain Education      Exclude from Growth Chart     Most recent vital signs: Vitals:   09/30/23 2211  BP: 121/66  Pulse: 71  Resp: 19  Temp: 97.8 F (36.6 C)  SpO2: 100%   General: Awake, alert, oriented. CV:  Good peripheral perfusion. Regular rate and rhythm. Resp:  Normal effort. Lungs clear. Abd:  No distention.  ENT:  No oropharyngeal swelling or erythema.   ED Results / Procedures / Treatments   Labs (all labs ordered are listed, but only abnormal results are displayed) Labs Reviewed  BASIC METABOLIC PANEL - Abnormal; Notable for the following components:      Result Value   Glucose, Bld 102 (*)    Creatinine, Ser 1.22 (*)    GFR, Estimated  59 (*)    All other components within normal limits  CBC  POC URINE PREG, ED  TROPONIN I (HIGH SENSITIVITY)     EKG  I, Phineas Semen, attending physician, personally viewed and interpreted this EKG  EKG Time: 2216 Rate: 72 Rhythm: normal sinus rhythm Axis: rightward axis Intervals: qtc 418 QRS: narrow ST changes: no st elevation Impression: abnormal ekg   RADIOLOGY I independently interpreted and visualized the CXR. My interpretation: No pneumonia Radiology interpretation:  IMPRESSION:  No active cardiopulmonary disease.      PROCEDURES:  Critical Care performed: No  MEDICATIONS ORDERED IN ED: Medications - No data to display   IMPRESSION / MDM / ASSESSMENT AND PLAN / ED COURSE  I reviewed the triage vital signs and the nursing notes.                              Differential diagnosis includes, but is not limited to, pharyngitis, globus, anxiety  Patient's presentation is most consistent with acute presentation  with potential threat to life or bodily function.   Patient presented to the emergency department today because of concern for throat swelling, lightheadedness and crying. On exam patient does appear slightly anxious. Lungs clear. Blood work, EKG and CXR without significant abnormalities. At this time do wonder if patient is suffering from anxiety. Will give medication to try to help with symptoms and reassess.  Patient did feel improvement after medication. At this time given clinical improvement and reassuring work up feel patient is safe for discharge.     FINAL CLINICAL IMPRESSION(S) / ED DIAGNOSES   Final diagnoses:  Anxiety  Globus sensation     Note:  This document was prepared using Dragon voice recognition software and may include unintentional dictation errors.    Phineas Semen, MD 10/01/23 0100

## 2023-10-01 NOTE — Discharge Instructions (Signed)
Please seek medical attention for any high fevers, chest pain, shortness of breath, change in behavior, persistent vomiting, bloody stool or any other new or concerning symptoms.  

## 2023-10-07 ENCOUNTER — Other Ambulatory Visit: Payer: Self-pay

## 2023-10-07 ENCOUNTER — Emergency Department
Admission: EM | Admit: 2023-10-07 | Discharge: 2023-10-07 | Disposition: A | Discharge status: Home / Self Care | Payer: Medicaid Other | Attending: Emergency Medicine | Admitting: Emergency Medicine

## 2023-10-07 DIAGNOSIS — R079 Chest pain, unspecified: Secondary | ICD-10-CM | POA: Diagnosis not present

## 2023-10-07 DIAGNOSIS — F419 Anxiety disorder, unspecified: Secondary | ICD-10-CM | POA: Diagnosis present

## 2023-10-07 LAB — BASIC METABOLIC PANEL
Anion gap: 7 (ref 5–15)
BUN: 9 mg/dL (ref 6–20)
CO2: 23 mmol/L (ref 22–32)
Calcium: 8.7 mg/dL — ABNORMAL LOW (ref 8.9–10.3)
Chloride: 106 mmol/L (ref 98–111)
Creatinine, Ser: 0.72 mg/dL (ref 0.44–1.00)
GFR, Estimated: >60 mL/min (ref >60)
Glucose, Bld: 97 mg/dL (ref 70–99)
Potassium: 3.7 mmol/L (ref 3.5–5.1)
Sodium: 136 mmol/L (ref 135–145)

## 2023-10-07 MED ORDER — HYDROXYZINE HCL 25 MG PO TABS
25.0000 mg | ORAL_TABLET | Freq: Once | ORAL | Status: AC
  Administered 2023-10-07: 25 mg via ORAL
  Filled 2023-10-07: qty 1

## 2023-10-07 MED ORDER — HYDROXYZINE HCL 25 MG PO TABS: 25.0000 mg | ORAL_TABLET | Freq: Three times a day (TID) | ORAL | 1 refills | Status: AC | PRN

## 2023-10-07 NOTE — ED Triage Notes (Signed)
Pt reports chest pain shortness of breath and headache xfew days. Pt states she was seen here last week and dx with anxiety for similar symotoms

## 2023-10-07 NOTE — Discharge Instructions (Signed)
Try to start incorporating simple exercise into your life, even just going for a walk.   Hydroxyzine as needed for increased anxiety.

## 2023-10-07 NOTE — ED Provider Notes (Signed)
[facility] Provider Note    Event Date/Time   First MD Initiated Contact with Patient 10/07/23 0255     (approximate)   History   Chest Pain   HPI  Jordan [name] is a 37 y.o. female who presents to the ED for evaluation of Chest Pain   Review ED visit from 11/24 and 11/25, and telemedicine visit on 11/26, where she was seen for similar symptoms.  I reviewed these workups, EKGs and blood work.  Diagnosed with anxiety.  Did have metabolic panel on 11/24 with an AKI, creatinine 1.22.  Patient presents to the ED due to continued persistent symptoms for the past week of "feeling weird."  She has difficulty describing her symptoms.  Denies any acute changes since these various visits in the past week.  Reports she came in tonight because she called the hospitalist nurse line after reviewing her MyChart and seeing a high-sensitivity troponin of 3, the nurse told her she needs to come into the ER and patient is uncertain why.  Adamantly denies any suicidal thoughts.  Denies recreational drug use.  Denies audiovisual hallucinations   Physical Exam   Triage Vital Signs: ED Triage Vitals  Encounter Vitals Group     BP 10/07/23 0251 (!) 129/56     Systolic BP Percentile --      Diastolic BP Percentile --      Pulse Rate 10/07/23 0251 71     Resp 10/07/23 0251 18     Temp 10/07/23 0251 97.7 F (36.5 C)     Temp Source 10/07/23 0251 Oral     SpO2 10/07/23 0251 100 %     Weight 10/07/23 0249 103 lb (46.7 kg)     Height 10/07/23 0249 5\' 2"  (1.575 m)     Head Circumference --      Peak Flow --      Pain Score 10/07/23 0249 5     Pain Loc --      Pain Education --      Exclude from Growth Chart --     Most recent vital signs: Vitals:   10/07/23 0330 10/07/23 0400  BP: 106/60 107/73  Pulse: (!) 58 64  Resp: 15 16  Temp:    SpO2: 100% 100%    General: Awake, no distress.  Anxious, somewhat pressured speech but with linear thoughts CV:  Good  peripheral perfusion.  Resp:  Normal effort.  Abd:  No distention.  MSK:  No deformity noted.  Neuro:  No focal deficits appreciated. Other:     ED Results / Procedures / Treatments   Labs (all labs ordered are listed, but only abnormal results are displayed) Labs Reviewed  BASIC METABOLIC PANEL - Abnormal; Notable for the following components:      Result Value   Calcium 8.7 (*)    All other components within normal limits    EKG Sinus rhythm with a rate of 64 bpm.  Normal axis and intervals.  No evidence of acute ischemia.  RADIOLOGY   Official radiology report(s): No results found.  PROCEDURES and INTERVENTIONS:  Procedures  Medications  hydrOXYzine (ATARAX) tablet 25 mg (25 mg Oral Given 10/07/23 0319)     IMPRESSION / MDM / ASSESSMENT AND PLAN / ED COURSE  I reviewed the triage vital signs and the nursing notes.  Differential diagnosis includes, but is not limited to, anxiety, hyperkalemia or hyperuricemia, ACS  {Patient presents with symptoms of an acute illness or injury that  is potentially life-threatening.  Patient presents with continued vague symptoms of feeling weird.  Due to her chest discomfort we will obtain a EKG. will recheck another metabolic panel considering abnormalities on previous evaluation.  Will provide hydroxyzine due to her anxiety.  Clinical Course as of 10/07/23 [place] Peers Oct 07, 2023  0408 Reassessed.  Patient reports feeling better.  She is appreciative.  We discussed normalization of her creatinine.  We discussed prescription for hydroxyzine, expectant management and return precautions. [DS]    Clinical Course User Index [DS] Delton Prairie, MD     FINAL CLINICAL IMPRESSION(S) / ED DIAGNOSES   Final diagnoses:  Anxiety     Rx / DC Orders   ED Discharge Orders          Ordered    hydrOXYzine (ATARAX) 25 MG tablet  3 times daily PRN        10/07/23 0409             Note:  This document was prepared using Dragon  voice recognition software and may include unintentional dictation errors.   Delton Prairie, MD 10/07/23 [phone]

## 2023-10-18 ENCOUNTER — Emergency Department: Payer: Medicaid Other

## 2023-10-18 ENCOUNTER — Encounter: Payer: Self-pay | Admitting: Emergency Medicine

## 2023-10-18 ENCOUNTER — Other Ambulatory Visit: Payer: Self-pay

## 2023-10-18 ENCOUNTER — Emergency Department
Admission: EM | Admit: 2023-10-18 | Discharge: 2023-10-19 | Disposition: A | Discharge status: Home / Self Care | Payer: Medicaid Other | Attending: Emergency Medicine | Admitting: Emergency Medicine

## 2023-10-18 DIAGNOSIS — Z20822 Contact with and (suspected) exposure to covid-19: Secondary | ICD-10-CM | POA: Insufficient documentation

## 2023-10-18 DIAGNOSIS — J45909 Unspecified asthma, uncomplicated: Secondary | ICD-10-CM | POA: Diagnosis not present

## 2023-10-18 DIAGNOSIS — R0602 Shortness of breath: Secondary | ICD-10-CM | POA: Diagnosis present

## 2023-10-18 DIAGNOSIS — R091 Pleurisy: Secondary | ICD-10-CM | POA: Insufficient documentation

## 2023-10-18 LAB — RESP PANEL BY RT-PCR (RSV, FLU A&B, COVID)  RVPGX2
Influenza A by PCR: NEGATIVE
Influenza B by PCR: NEGATIVE
Resp Syncytial Virus by PCR: NEGATIVE
SARS Coronavirus 2 by RT PCR: NEGATIVE

## 2023-10-18 MED ORDER — KETOROLAC TROMETHAMINE 30 MG/ML IJ SOLN
15.0000 mg | Freq: Once | INTRAMUSCULAR | Status: AC
  Administered 2023-10-19: 15 mg via INTRAVENOUS
  Filled 2023-10-18: qty 1

## 2023-10-18 MED ORDER — ALBUTEROL SULFATE HFA 108 (90 BASE) MCG/ACT IN AERS
2.0000 | INHALATION_SPRAY | Freq: Once | RESPIRATORY_TRACT | Status: AC
Start: 2023-10-19 — End: 2023-10-19
  Administered 2023-10-19: 2 via RESPIRATORY_TRACT
  Filled 2023-10-18: qty 6.7

## 2023-10-18 NOTE — ED Triage Notes (Signed)
Patient ambulatory to triage with complaints of shortness of breath and headache x2 days. Also states she feels like she has mucus in her throat. Denies cough/congestion/fevers.

## 2023-10-18 NOTE — ED Provider Notes (Signed)
[facility] Provider Note    Event Date/Time   First MD Initiated Contact with Patient 10/18/23 2341     (approximate)   History   Shortness of Breath   HPI  Jordan [name] is a 37 y.o. female who presents to the ED from home with a chief complaint of shortness of breath x 2 days.  Feels like she cannot take a good deep breath and experiences sharp chest pain when she tries to.  Also feels like she has phlegm in her throat that she cannot expectorate.  Denies fever/chills, cough, congestion, abdominal pain, nausea, vomiting or dizziness.  History of bilateral salpingectomies.  Does a lot of local driving and recent travel to [place].  No OCPs.  No trauma.     Past Medical History   Past Medical History:  Diagnosis Date   Anemia    Anxiety    Asthma    childhood asthma   Headache    Heart murmur 37 years old   Interstitial cystitis    Interstitial cystitis 37 years old   Placenta previa    Restless leg syndrome      Active Problem List   Patient Active Problem List   Diagnosis Date Noted   Ectopic pregnancy 05/17/2016   Left lateral abdominal pain 05/17/2016   Post-operative state 02/14/2016   First trimester screening 08/23/2015     Past Surgical History   Past Surgical History:  Procedure Laterality Date   CESAREAN SECTION  2011/2013   CESAREAN SECTION WITH BILATERAL TUBAL LIGATION Bilateral 02/14/2016   Procedure: CESAREAN SECTION;  Surgeon: Suzy Bouchard, MD;  Location: [facility] ORS;  Service: Obstetrics;  Laterality: Bilateral;   DILATION AND CURETTAGE OF UTERUS     DILATION AND CURETTAGE OF UTERUS N/A 06/18/2016   Procedure: DILATATION AND CURETTAGE;  Surgeon: Christeen Douglas, MD;  Location: [facility] ORS;  Service: Gynecology;  Laterality: N/A;     Home Medications   Prior to Admission medications   Medication Sig Start Date End Date Taking? Authorizing Provider  methylPREDNISolone (MEDROL DOSEPAK) 4 MG TBPK tablet Take  as directed 10/19/23  Yes Irean Hong, MD  albuterol (VENTOLIN HFA) 108 (90 Base) MCG/ACT inhaler Inhale 2 puffs into the lungs every 6 (six) hours as needed for wheezing or shortness of breath. 05/03/19   Willy Eddy, MD  hydrOXYzine (ATARAX) 25 MG tablet Take 1 tablet (25 mg total) by mouth 3 (three) times daily as needed for anxiety. 10/07/23   Delton Prairie, MD  nicotine polacrilex (NICORETTE) 2 MG gum Take 2 mg by mouth as needed for smoking cessation.    [provider]  Prenatal Vit-Fe Fumarate-FA (MULTIVITAMIN-PRENATAL) 27-0.8 MG TABS tablet Take 1 tablet by mouth daily at 12 noon.    [provider]     Allergies  Amoxicillin, Hydrocodone-acetaminophen, Macrobid [nitrofurantoin], and Vicodin [hydrocodone-acetaminophen]   Family History  History reviewed. No pertinent family history.   Physical Exam  Triage Vital Signs: ED Triage Vitals [10/18/23 2053]  Encounter Vitals Group     BP (!) 108/54     Systolic BP Percentile      Diastolic BP Percentile      Pulse Rate 71     Resp 18     Temp 98.1 F (36.7 C)     Temp Source Oral     SpO2 100 %     Weight 102 lb 15.3 oz (46.7 kg)     Height 5\' 2"  (1.575 m)  Head Circumference      Peak Flow      Pain Score 3     Pain Loc      Pain Education      Exclude from Growth Chart     Updated Vital Signs: BP (!) 111/55   Pulse (!) 57   Temp 98.1 F (36.7 C) (Oral)   Resp 16   Ht 5\' 2"  (1.575 m)   Wt 46.7 kg   LMP 09/30/2023 (Exact Date)   SpO2 100%   BMI 18.83 kg/m    General: Awake, no distress.  CV:  RRR.  Good peripheral perfusion.  Resp:  Normal effort.  Diminished, otherwise CTAB.  Chest wall tender to palpation. Abd:  Nontender.  No distention.  Other:  Bilateral calves are supple without tenderness.   ED Results / Procedures / Treatments  Labs (all labs ordered are listed, but only abnormal results are displayed) Labs Reviewed  BASIC METABOLIC PANEL - Abnormal; Notable for the  following components:      Result Value   CO2 20 (*)    All other components within normal limits  RESP PANEL BY RT-PCR (RSV, FLU A&B, COVID)  [id number]  CBC WITH DIFFERENTIAL/PLATELET  D-DIMER, QUANTITATIVE  TROPONIN I (HIGH SENSITIVITY)     EKG  ED ECG REPORT I, Sherilynn Dieu J, the attending physician, personally viewed and interpreted this ECG.   Date: 10/19/2023  EKG Time: 2101  Rate: 73  Rhythm: normal sinus rhythm  Axis: RAD  Intervals:none  ST&T Change: Nonspecific    RADIOLOGY I have independently visualized and interpreted patient's imaging study as well as noted the radiology interpretation:  Chest x-ray: No acute cardiopulmonary process  Official radiology report(s): DG Chest 2 View Result Date: 10/18/2023 CLINICAL DATA:  Shortness of breath EXAM: CHEST - 2 VIEW COMPARISON:  09/30/2023 FINDINGS: The heart size and mediastinal contours are within normal limits. Both lungs are clear. The visualized skeletal structures are unremarkable. IMPRESSION: Normal study. Electronically Signed   By: Charlett Nose M.D.   On: 10/18/2023 21:18     PROCEDURES:  Critical Care performed: No  .1-3 Lead EKG Interpretation  Performed by: Irean Hong, MD Authorized by: Irean Hong, MD     Interpretation: normal     ECG rate:  75   ECG rate assessment: normal     Rhythm: sinus rhythm     Ectopy: none     Conduction: normal   Comments:     Patient placed on cardiac monitor to evaluate for arrhythmias    MEDICATIONS ORDERED IN ED: Medications  predniSONE (DELTASONE) tablet 30 mg (has no administration in time range)  ketorolac (TORADOL) 30 MG/ML injection 15 mg (15 mg Intravenous Given 10/19/23 0033)  albuterol (VENTOLIN HFA) 108 (90 Base) MCG/ACT inhaler 2 puff (2 puffs Inhalation Given 10/19/23 0032)     IMPRESSION / MDM / ASSESSMENT AND PLAN / ED COURSE  I reviewed the triage vital signs and the nursing notes.                             37 year old female  presenting with shortness of breath. Differential includes, but is not limited to, viral syndrome, bronchitis including COPD exacerbation, pneumonia, reactive airway disease including asthma, CHF including exacerbation with or without pulmonary/interstitial edema, pneumothorax, ACS, thoracic trauma, and pulmonary embolism.  I personally reviewed patient's records and note a PCP telemedicine visit on 10/16/2023 for anxiety.  Patient's  presentation is most consistent with acute illness with systemic symptoms.  The patient is on the cardiac monitor to evaluate for evidence of arrhythmia and/or significant heart rate changes.  Respiratory panel and chest x-ray unremarkable.  Given patient's complaint of pleuritic chest pain coupled with travel history, will obtain lab work including troponin.  Administer Albuterol inhaler, IV Ketorolac and reassess.  Clinical Course as of 10/19/23 0135  Fri Oct 19, 2023  0130 Laboratory results unremarkable including troponin and D-dimer.  Respiratory panel is negative.  Suspect pleurisy; will start Medrol Dosepak.  Instructed patient to use inhaler as needed.  Strict return precautions given.  Patient verbalizes understanding and agrees with plan of care. [JS]    Clinical Course User Index [JS] Irean Hong, MD     FINAL CLINICAL IMPRESSION(S) / ED DIAGNOSES   Final diagnoses:  Shortness of breath  Pleurisy     Rx / DC Orders   ED Discharge Orders          Ordered    methylPREDNISolone (MEDROL DOSEPAK) 4 MG TBPK tablet        10/19/23 0134             Note:  This document was prepared using Dragon voice recognition software and may include unintentional dictation errors.   Irean Hong, MD 10/19/23 [phone]

## 2023-10-19 LAB — CBC WITH DIFFERENTIAL/PLATELET
Abs Immature Granulocytes: 0.01 10*3/uL (ref 0.00–0.07)
Basophils Absolute: 0.1 10*3/uL (ref 0.0–0.1)
Basophils Relative: 1 %
Eosinophils Absolute: 0.2 10*3/uL (ref 0.0–0.5)
Eosinophils Relative: 2 %
HCT: 39.8 % (ref 36.0–46.0)
Hemoglobin: 13 g/dL (ref 12.0–15.0)
Immature Granulocytes: 0 %
Lymphocytes Relative: 37 %
Lymphs Abs: 3.2 10*3/uL (ref 0.7–4.0)
MCH: 27.9 pg (ref 26.0–34.0)
MCHC: 32.7 g/dL (ref 30.0–36.0)
MCV: 85.4 fL (ref 80.0–100.0)
Monocytes Absolute: 0.8 10*3/uL (ref 0.1–1.0)
Monocytes Relative: 9 %
Neutro Abs: 4.3 10*3/uL (ref 1.7–7.7)
Neutrophils Relative %: 51 %
Platelets: 201 10*3/uL (ref 150–400)
RBC: 4.66 MIL/uL (ref 3.87–5.11)
RDW: 13.4 % (ref 11.5–15.5)
WBC: 8.5 10*3/uL (ref 4.0–10.5)
nRBC: 0 % (ref 0.0–0.2)

## 2023-10-19 LAB — BASIC METABOLIC PANEL
Anion gap: 8 (ref 5–15)
BUN: 8 mg/dL (ref 6–20)
CO2: 20 mmol/L — ABNORMAL LOW (ref 22–32)
Calcium: 8.9 mg/dL (ref 8.9–10.3)
Chloride: 107 mmol/L (ref 98–111)
Creatinine, Ser: 0.69 mg/dL (ref 0.44–1.00)
GFR, Estimated: >60 mL/min (ref >60)
Glucose, Bld: 91 mg/dL (ref 70–99)
Potassium: 4.1 mmol/L (ref 3.5–5.1)
Sodium: 135 mmol/L (ref 135–145)

## 2023-10-19 LAB — TROPONIN I (HIGH SENSITIVITY): Troponin I (High Sensitivity): 3 ng/L (ref <18)

## 2023-10-19 LAB — D-DIMER, QUANTITATIVE: D-Dimer, Quant: <0.27 ug{FEU}/mL (ref 0.00–0.50)

## 2023-10-19 MED ORDER — METHYLPREDNISOLONE 4 MG PO TBPK: ORAL_TABLET | ORAL | 0 refills | Status: AC

## 2023-10-19 MED ORDER — PREDNISONE 20 MG PO TABS
30.0000 mg | ORAL_TABLET | Freq: Once | ORAL | Status: AC
  Administered 2023-10-19: 30 mg via ORAL
  Filled 2023-10-19: qty 2

## 2023-10-19 NOTE — Discharge Instructions (Signed)
1.  Take and finish steroid taper as prescribed. 2.  You may use Albuterol inhaler 2 puffs every 4 hours as needed for difficulty breathing. 3.  Return to the ER for worsening symptoms, persistent vomiting, difficulty breathing or other concerns.

## 2023-10-19 NOTE — ED Notes (Signed)
Patient given discharge instructions including prescriptions x1 and importance of follow up appt as needed with stated understanding. INT removed, cannula intact, pressure dressing applied. Patient stable and ambulatory with steady even gait on dispo.

## 2023-12-05 ENCOUNTER — Other Ambulatory Visit: Payer: Self-pay

## 2023-12-05 ENCOUNTER — Encounter: Payer: Self-pay | Admitting: *Deleted

## 2023-12-05 DIAGNOSIS — R002 Palpitations: Secondary | ICD-10-CM | POA: Diagnosis present

## 2023-12-05 DIAGNOSIS — F419 Anxiety disorder, unspecified: Secondary | ICD-10-CM | POA: Diagnosis not present

## 2023-12-05 LAB — BASIC METABOLIC PANEL
Anion gap: 9 (ref 5–15)
BUN: 8 mg/dL (ref 6–20)
CO2: 22 mmol/L (ref 22–32)
Calcium: 9.1 mg/dL (ref 8.9–10.3)
Chloride: 105 mmol/L (ref 98–111)
Creatinine, Ser: 0.77 mg/dL (ref 0.44–1.00)
GFR, Estimated: >60 mL/min (ref >60)
Glucose, Bld: 83 mg/dL (ref 70–99)
Potassium: 3.4 mmol/L — ABNORMAL LOW (ref 3.5–5.1)
Sodium: 136 mmol/L (ref 135–145)

## 2023-12-05 LAB — CBC
HCT: 38 % (ref 36.0–46.0)
Hemoglobin: 12.6 g/dL (ref 12.0–15.0)
MCH: 27.6 pg (ref 26.0–34.0)
MCHC: 33.2 g/dL (ref 30.0–36.0)
MCV: 83.3 fL (ref 80.0–100.0)
Platelets: 226 10*3/uL (ref 150–400)
RBC: 4.56 MIL/uL (ref 3.87–5.11)
RDW: 12.9 % (ref 11.5–15.5)
WBC: 8.4 10*3/uL (ref 4.0–10.5)
nRBC: 0 % (ref 0.0–0.2)

## 2023-12-05 NOTE — ED Provider Triage Note (Signed)
Emergency Medicine Provider Triage Evaluation Note  Jordan [name] , a 38 y.o. female  was evaluated in triage.  Pt complains of headache, tremors, blurry vision.  Patient is concerned because she checked her heart rate at home and it was 100.  Patient took Cymbalta last 6 cups of coffee  Review of Systems  Positive:  Negative:   Physical Exam  BP 103/61 (BP Location: Left Arm)   Pulse 75   Temp 98.3 F (36.8 C) (Oral)   Resp 20   LMP 11/28/2023   SpO2 100%  Gen:   Awake, no distress   Resp:  Normal effort  MSK:   Moves extremities without difficulty  Other:    Medical Decision Making  Medically screening exam initiated at 10:48 PM.  Appropriate orders placed.  LAURAJEAN HOSEK was informed that the remainder of the evaluation will be completed by another provider, this initial triage assessment does not replace that evaluation, and the importance of remaining in the ED until their evaluation is complete.  Patient had physical exam does not have tachycardia, blood pressure is normal.  I ordered CBC BMP   Gladys Damme, PA-C 12/05/23 2249

## 2023-12-05 NOTE — ED Triage Notes (Signed)
Pt is here for evaluation of feeling unwell which began today.  Pt is on Cymbalta and is concerned about Serotonin syndrome due to headache, feeling of sob (fortunately pt does not appear sob and spo2 is 100% in triage) pt also feels like hear heart is "beating funny" feeling agitated and feeling like she can not focus her vision well.  Pt had stopped taking cymbalta but started back taking it yesterday and also drank caffeine with this (6+mountain dews in a day)

## 2023-12-05 NOTE — ED Notes (Signed)
Full rainbow drawn and sent.  PA in room to see pt.

## 2023-12-06 ENCOUNTER — Emergency Department
Admission: EM | Admit: 2023-12-06 | Discharge: 2023-12-06 | Disposition: A | Discharge status: Home / Self Care | Payer: Medicaid Other | Attending: Emergency Medicine | Admitting: Emergency Medicine

## 2023-12-06 DIAGNOSIS — R002 Palpitations: Secondary | ICD-10-CM

## 2023-12-06 NOTE — Discharge Instructions (Signed)
You were seen in the ER today for evaluation of your palpitations. Your exam, EKG, and labs were reassuring against an emergency cause for this. Please follow-up with your primary care provider within a few days. Return to the ER for worsening chest pain, difficulty breathing, or any other new or concerning symptoms.

## 2023-12-06 NOTE — ED Provider Notes (Signed)
[facility] Provider Note    Event Date/Time   First MD Initiated Contact with Patient 12/06/23 [phone]     (approximate)   History   Headache and Anxiety   HPI  Jordan [name] is a 38 year old female with history of anxiety presenting to the emergency department for evaluation of palpitations.  Patient started Cymbalta a few weeks ago, but that it made her constipated so she had discontinued this.  Had worse any anxious mood so she restarted it yesterday.  Today, had onset of palpitations and headache.  Spoke with a friend who said that she had had similar symptoms with serotonin syndrome and recommended that the patient get evaluated in the ER.  Patient does additionally report that she has had high doses of caffeine today.  At the time of my evaluation, denies any ongoing palpitations or headache. No chest pain, shortness of breath.  No numbness, tingling, focal weakness.     Physical Exam   Triage Vital Signs: ED Triage Vitals  Encounter Vitals Group     BP 12/05/23 2224 103/61     Systolic BP Percentile --      Diastolic BP Percentile --      Pulse Rate 12/05/23 2224 75     Resp 12/05/23 2224 20     Temp 12/05/23 2224 98.3 F (36.8 C)     Temp Source 12/05/23 2224 Oral     SpO2 12/05/23 2224 100 %     Weight --      Height --      Head Circumference --      Peak Flow --      Pain Score 12/05/23 2225 5     Pain Loc --      Pain Education --      Exclude from Growth Chart --     Most recent vital signs: Vitals:   12/06/23 0235 12/06/23 0446  BP: (!) 112/47 (!) 97/58  Pulse: (!) 59 66  Resp: 18 18  Temp: 98.1 F (36.7 C) 98.2 F (36.8 C)  SpO2: 99% 99%     General: Awake, interactive  CV:  Regular rate, good peripheral perfusion.  Resp:  Unlabored respirations, lungs clear to auscultation Abd:  Nondistended, soft, nontender to palpation Neuro:  Alert and oriented, normal extraocular movements, symmetric facial movement, sensation  intact over bilateral upper and lower extremities with 5 out of 5 strength.  Normal finger-to-nose testing.   ED Results / Procedures / Treatments   Labs (all labs ordered are listed, but only abnormal results are displayed) Labs Reviewed  BASIC METABOLIC PANEL - Abnormal; Notable for the following components:      Result Value   Potassium 3.4 (*)    All other components within normal limits  CBC     EKG EKG independently reviewed interpreted by myself (ER attending) demonstrates:  EKG demonstrates normal sinus rhythm rate of 67, PR 158, QRS 82, QTc 399, no acute ST changes  RADIOLOGY Imaging independently reviewed and interpreted by myself demonstrates:    PROCEDURES:  Critical Care performed: No  Procedures   MEDICATIONS ORDERED IN ED: Medications - No data to display   IMPRESSION / MDM / ASSESSMENT AND PLAN / ED COURSE  I reviewed the triage vital signs and the nursing notes.  Differential diagnosis includes, but is not limited to, anemia, electrolyte abnormality, caffeine intoxication, medication adverse effect, very low suspicion for serotonin syndrome based on clinical history  Patient's presentation is most  consistent with acute illness / injury with system symptoms.  38 year old female presenting with resolved palpitations.  Labs here without critical derangements.  EKG overall reassuring.  Normal mental status, no autonomic dysfunction or neuromuscular findings.  On single serotonergic agent taken at therapeutic dose.  Presentation is not consistent with serotonin syndrome.  Palpitations may be related to caffeine use, known history of anxiety.  Discussed this with the patient who is reassured with results of her workup.  She is comfortable with discharge home.  Strict return precautions provided.  Patient discharged stable condition.     FINAL CLINICAL IMPRESSION(S) / ED DIAGNOSES   Final diagnoses:  Palpitations     Rx / DC Orders   ED Discharge  Orders     None        Note:  This document was prepared using Dragon voice recognition software and may include unintentional dictation errors.   Trinna Post, MD 12/06/23 [phone]

## 2024-04-05 ENCOUNTER — Other Ambulatory Visit: Payer: Self-pay

## 2024-04-05 DIAGNOSIS — Z7951 Long term (current) use of inhaled steroids: Secondary | ICD-10-CM | POA: Insufficient documentation

## 2024-04-05 DIAGNOSIS — J45909 Unspecified asthma, uncomplicated: Secondary | ICD-10-CM | POA: Diagnosis not present

## 2024-04-05 DIAGNOSIS — K5900 Constipation, unspecified: Secondary | ICD-10-CM | POA: Insufficient documentation

## 2024-04-05 NOTE — ED Triage Notes (Signed)
 Pt presents via POV c/o constipation x2 weeks. Ambulatory to triage.

## 2024-04-06 ENCOUNTER — Other Ambulatory Visit: Payer: Self-pay

## 2024-04-06 ENCOUNTER — Emergency Department
Admission: EM | Admit: 2024-04-06 | Discharge: 2024-04-06 | Disposition: A | Discharge status: Home / Self Care | Attending: Emergency Medicine | Admitting: Emergency Medicine

## 2024-04-06 ENCOUNTER — Emergency Department

## 2024-04-06 ENCOUNTER — Emergency Department
Admission: EM | Admit: 2024-04-06 | Discharge: 2024-04-07 | Disposition: A | Discharge status: Home / Self Care | Attending: Emergency Medicine | Admitting: Emergency Medicine

## 2024-04-06 DIAGNOSIS — K5900 Constipation, unspecified: Secondary | ICD-10-CM | POA: Insufficient documentation

## 2024-04-06 LAB — URINALYSIS, ROUTINE W REFLEX MICROSCOPIC
Bilirubin Urine: NEGATIVE
Glucose, UA: NEGATIVE mg/dL
Hgb urine dipstick: NEGATIVE
Ketones, ur: NEGATIVE mg/dL
Leukocytes,Ua: NEGATIVE
Nitrite: NEGATIVE
Protein, ur: NEGATIVE mg/dL
Specific Gravity, Urine: 1.02 (ref 1.005–1.030)
pH: 7 (ref 5.0–8.0)

## 2024-04-06 LAB — POC URINE PREG, ED: Preg Test, Ur: NEGATIVE

## 2024-04-06 MED ORDER — DICYCLOMINE HCL 20 MG PO TABS
20.0000 mg | ORAL_TABLET | Freq: Four times a day (QID) | ORAL | 0 refills | Status: AC
Start: 2024-04-06 — End: ?

## 2024-04-06 MED ORDER — LACTULOSE 10 GM/15ML PO SOLN: 20.0000 g | Freq: Every day | ORAL | 0 refills | Status: AC | PRN

## 2024-04-06 MED ORDER — LACTULOSE 10 GM/15ML PO SOLN
30.0000 g | Freq: Once | ORAL | Status: AC
  Administered 2024-04-06: 30 g via ORAL
  Filled 2024-04-06: qty 60

## 2024-04-06 NOTE — ED Provider Notes (Signed)
 [facility] Provider Note    None    (approximate)   History   Constipation   HPI  Jordan [name] is a 38 y.o. female who presents to the ED from home with a chief complaint of constipation x 2 weeks.  Patient reports history of ongoing constipation.  Has had tiny BMs "here and there".  Started MiraLAX yesterday.  Denies associated fever/chills, chest pain, shortness of breath, nausea, vomiting, urinary retention or diarrhea.     Past Medical History   Past Medical History:  Diagnosis Date   Anemia    Anxiety    Asthma    childhood asthma   Headache    Heart murmur 38 years old   Interstitial cystitis    Interstitial cystitis 38 years old   Placenta previa    Restless leg syndrome      Active Problem List   Patient Active Problem List   Diagnosis Date Noted   Ectopic pregnancy 05/17/2016   Left lateral abdominal pain 05/17/2016   Post-operative state 02/14/2016   First trimester screening 08/23/2015     Past Surgical History   Past Surgical History:  Procedure Laterality Date   CESAREAN SECTION  2011/2013   CESAREAN SECTION WITH BILATERAL TUBAL LIGATION Bilateral 02/14/2016   Procedure: CESAREAN SECTION;  Surgeon: Carolynn Citrin, MD;  Location: [facility] ORS;  Service: Obstetrics;  Laterality: Bilateral;   DILATION AND CURETTAGE OF UTERUS     DILATION AND CURETTAGE OF UTERUS N/A 06/18/2016   Procedure: DILATATION AND CURETTAGE;  Surgeon: Prescilla Brod, MD;  Location: [facility] ORS;  Service: Gynecology;  Laterality: N/A;     Home Medications   Prior to Admission medications   Medication Sig Start Date End Date Taking? Authorizing Provider  dicyclomine (BENTYL) 20 MG tablet Take 1 tablet (20 mg total) by mouth every 6 (six) hours. 04/06/24  Yes Dashanti Burr J, MD  lactulose (CHRONULAC) 10 GM/15ML solution Take 30 mLs (20 g total) by mouth daily as needed for mild constipation. 04/06/24  Yes Norlene Beavers, MD  albuterol  (VENTOLIN  HFA)  108 [phone] Base) MCG/ACT inhaler Inhale 2 puffs into the lungs every 6 (six) hours as needed for wheezing or shortness of breath. 05/03/19   Suellyn Emory, MD  hydrOXYzine  (ATARAX ) 25 MG tablet Take 1 tablet (25 mg total) by mouth 3 (three) times daily as needed for anxiety. 10/07/23   Arline Bennett, MD  methylPREDNISolone  (MEDROL  DOSEPAK) 4 MG TBPK tablet Take as directed 10/19/23   Kairee Isa J, MD  nicotine polacrilex (NICORETTE) 2 MG gum Take 2 mg by mouth as needed for smoking cessation.    [provider]  Prenatal Vit-Fe Fumarate-FA (MULTIVITAMIN-PRENATAL) 27-0.8 MG TABS tablet Take 1 tablet by mouth daily at 12 noon.    [provider]     Allergies  Amoxicillin, Hydrocodone-acetaminophen , Macrobid [nitrofurantoin], and Vicodin [hydrocodone-acetaminophen ]   Family History  History reviewed. No pertinent family history.   Physical Exam  Triage Vital Signs: ED Triage Vitals  Encounter Vitals Group     BP 04/06/24 0001 111/75     Systolic BP Percentile --      Diastolic BP Percentile --      Pulse Rate 04/06/24 0001 64     Resp 04/06/24 0001 19     Temp 04/06/24 0001 98.3 F (36.8 C)     Temp Source 04/06/24 0001 Oral     SpO2 04/06/24 0001 100 %     Weight 04/05/24  2356 100 lb (45.4 kg)     Height 04/05/24 2356 5\' 2"  (1.575 m)     Head Circumference --      Peak Flow --      Pain Score 04/05/24 2356 2     Pain Loc --      Pain Education --      Exclude from Growth Chart --     Updated Vital Signs: BP 111/75   Pulse 64   Temp 98.3 F (36.8 C) (Oral)   Resp 19   Ht 5\' 2"  (1.575 m)   Wt 45.4 kg   SpO2 100%   BMI 18.29 kg/m    General: Awake, no distress.  CV:  RRR. Good peripheral perfusion.  Resp:  Normal effort. CTAB. Abd:  Nontender to light or deep palpation. No distention.  Other:  No truncal vesicles.   ED Results / Procedures / Treatments  Labs (all labs ordered are listed, but only abnormal results are displayed) Labs Reviewed   URINALYSIS, ROUTINE W REFLEX MICROSCOPIC - Abnormal; Notable for the following components:      Result Value   Color, Urine YELLOW (*)    APPearance CLOUDY (*)    All other components within normal limits  POC URINE PREG, ED     EKG  None   RADIOLOGY I have independently visualized interpreted patient's imaging study as well as noted the radiology interpretation:  3 way abdomen: Moderate stool burden, no SBO  Official radiology report(s): DG Abd Acute W/Chest Result Date: 04/06/2024 EXAM: UPRIGHT AND SUPINE XRAY VIEWS OF THE ABDOMEN AND 4 OR 2 OR 1 VIEW(S) OF THE CHEST 04/06/2024 02:40:30 AM COMPARISON: Chest radiograph dated 10/18/2023. CLINICAL HISTORY: Pt presents via POV c/o constipation x2 weeks. Ambulatory to triage. Pt also complains of SOB. Pt states that she hurts in the lower left quadrant of her abdomen. Pt agreed to sign a pregnancy test waiver before imaging, document uploaded to pt's chart. FINDINGS: LUNGS AND PLEURA: No consolidation or pulmonary edema. No pleural effusion or pneumothorax. HEART AND MEDIASTINUM: No acute abnormality of the cardiac and mediastinal silhouettes. BOWEL: Nonobstructive bowel gas pattern. PERITONEUM AND SOFT TISSUES: No abnormal calcifications. No free air. BONES: No acute osseous abnormality. IMPRESSION: 1. No acute findings. Electronically signed by: Zadie Herter MD 04/06/2024 02:43 AM EDT RP Workstation: [id number]     PROCEDURES:  Critical Care performed: No  Procedures   MEDICATIONS ORDERED IN ED: Medications  lactulose (CHRONULAC) 10 GM/15ML solution 30 g (has no administration in time range)     IMPRESSION / MDM / ASSESSMENT AND PLAN / ED COURSE  I reviewed the triage vital signs and the nursing notes.                             38 year old female presenting with constipation.  No abdominal tenderness on exam, no nausea/vomiting.  Complains of occasional cloudy urine and history of interstitial cystitis.  Will wait for  UA results.  Plan to discharge home on Lactulose and Bentyl to use as needed.  Patient will follow-up closely with her PCP.  Strict return precautions given.  Patient verbalizes understanding and agrees with plan of care.  Patient's presentation is most consistent with acute, uncomplicated illness.  Clinical Course as of 04/06/24 0305  Sun Apr 06, 2024  0304 UA negative.   [JS]    Clinical Course User Index [JS] Norlene Beavers, MD   FINAL CLINICAL IMPRESSION(S) /  ED DIAGNOSES   Final diagnoses:  Constipation, unspecified constipation type     Rx / DC Orders   ED Discharge Orders          Ordered    lactulose (CHRONULAC) 10 GM/15ML solution  Daily PRN        04/06/24 0250    dicyclomine (BENTYL) 20 MG tablet  Every 6 hours        04/06/24 0250             Note:  This document was prepared using Dragon voice recognition software and may include unintentional dictation errors.   Tye Juarez J, MD 04/06/24 [phone]

## 2024-04-06 NOTE — ED Triage Notes (Signed)
 Pt arrived from home via EMS c/o constipation x 2 weeks. Pt states that she was seen here las night and discharged with laxatives that have not worked. Abd pain 10/10. BS + x 4 quads

## 2024-04-06 NOTE — ED Triage Notes (Signed)
 First RN Note: Pt to ED via [facility] from home with c/o RUQ abd pain. Per EMS pt has hx of IBS with constipation, has been constipated x 2 weeks. Was seen earlier today for same and prescribed laxative without relief.    116/63 100% RA 76HR 97.9 oral

## 2024-04-06 NOTE — Discharge Instructions (Signed)
 You may take Lactulose as needed for bowel movements, Bentyl as needed for abdominal cramps.  I recommend the following to regulate daily bowel movements: MiraLAX Stool softener such as Colace twice daily Increase fiber intake Increase fluid intake Return to the ER for worsening symptoms, persistent vomiting, difficulty breathing or other concerns.

## 2024-04-06 NOTE — ED Provider Notes (Signed)
 [facility] Provider Note    Event Date/Time   First MD Initiated Contact with Patient 04/06/24 2341     (approximate)   History   Constipation   HPI  Jordan [name] is a 38 y.o. female with a history of anxiety who presents with constipation.  The patient states that she has had only very small bowel movements over the last 2 weeks.  She also reports increasing lower abdominal pain mainly in the right lower abdomen.  She denies any dysuria or frequency.  She has no nausea or vomiting.  The patient had taken a dose of MiraLAX at home and was prescribed lactulose in the ED yesterday.  She took it but has not had a bowel movement.  Since coming to ED, the pain has worsened.  I reviewed the past medical records.  The patient was seen in the ED last night also with constipation.  She was started on lactulose.  Abdominal x-ray showed no acute abnormalities.  Previously her most recent outpatient encounter was on 5/29 with family medicine for evaluation of cough and chest pain.  Chest x-ray was negative at that time.   Physical Exam   Triage Vital Signs: ED Triage Vitals  Encounter Vitals Group     BP 04/06/24 2220 118/64     Systolic BP Percentile --      Diastolic BP Percentile --      Pulse Rate 04/06/24 2220 67     Resp 04/06/24 2220 18     Temp 04/06/24 2220 98.4 F (36.9 C)     Temp Source 04/06/24 2220 Oral     SpO2 04/06/24 2220 100 %     Weight 04/06/24 2221 100 lb (45.4 kg)     Height 04/06/24 2221 5\' 2"  (1.575 m)     Head Circumference --      Peak Flow --      Pain Score 04/06/24 2221 8     Pain Loc --      Pain Education --      Exclude from Growth Chart --     Most recent vital signs: Vitals:   04/07/24 0130 04/07/24 0252  BP: 120/68 124/70  Pulse: (!) 55 62  Resp: 18 18  Temp: 98.3 F (36.8 C) 98.3 F (36.8 C)  SpO2: 99% 99%     General: Alert, well-appearing, no distress.  CV:  Good peripheral perfusion.  Resp:  Normal  effort.  Abd:  Soft with mild bilateral lower quadrant tenderness.  No distention.  Other:  No jaundice or scleral icterus.   ED Results / Procedures / Treatments   Labs (all labs ordered are listed, but only abnormal results are displayed) Labs Reviewed  COMPREHENSIVE METABOLIC PANEL WITH GFR  LIPASE, BLOOD  CBC WITH DIFFERENTIAL/PLATELET  POC URINE PREG, ED     EKG     RADIOLOGY  CT abdomen/pelvis: I independently viewed and interpreted the images; there is stool in the colon with no dilated bowel loops or any free air or free fluid.  Radiology report indicates the following:  IMPRESSION:  1. Moderate colonic stool burden without rectal fecal impaction.     PROCEDURES:  Critical Care performed: No  Procedures   MEDICATIONS ORDERED IN ED: Medications  ketorolac  (TORADOL ) 15 MG/ML injection 15 mg (15 mg Intravenous Given 04/07/24 0020)  iohexol (OMNIPAQUE) 300 MG/ML solution 80 mL (80 mLs Intravenous Contrast Given 04/07/24 0205)  magnesium citrate solution 1 Bottle (1 Bottle Oral Given  04/07/24 0248)     IMPRESSION / MDM / ASSESSMENT AND PLAN / ED COURSE  I reviewed the triage vital signs and the nursing notes.  38 year old female with PMH as noted above presents with constipation for the last 2 weeks with worsening lower abdominal pain.  It has not responded to lactulose that she was prescribed in the ED yesterday.  On exam the patient is overall well-appearing and her vital signs are normal.  She does have some tenderness in the lower abdomen.  Differential diagnosis includes, but is not limited to, simple constipation, less likely colitis, diverticulitis, bowel obstruction, volvulus.  Given the worsening pain and lack of response to medication, we will obtain a CT for further evaluation as well as lab workup.  Patient's presentation is most consistent with acute complicated illness / injury requiring diagnostic  workup.  ----------------------------------------- 2:27 AM on 04/07/2024 -----------------------------------------   CT shows a moderate stool burden with no other acute findings.  CMP, CBC, and lipase are all within normal limits.  At this time the patient's pain is improved with Toradol .  There is no evidence of acute complication.  I gave her the option of giving additional constipation treatment in the ED and waiting for her to have a bowel movement before discharge, however the patient would prefer to go home, which I feel is reasonable.  We have given her a bottle of magnesium citrate to take when she arrives home.  I have also prescribed her fleets enemas.  I instructed her to continue the MiraLAX.  I gave strict return precautions, and she expresses understanding.  FINAL CLINICAL IMPRESSION(S) / ED DIAGNOSES   Final diagnoses:  Constipation, unspecified constipation type     Rx / DC Orders   ED Discharge Orders          Ordered    sodium phosphate  (FLEET) ENEM  Daily PRN        04/07/24 0227             Note:  This document was prepared using Dragon voice recognition software and may include unintentional dictation errors.    Lind Repine, MD 04/07/24 [phone]

## 2024-04-07 ENCOUNTER — Emergency Department

## 2024-04-07 LAB — LIPASE, BLOOD: Lipase: 37 U/L (ref 11–51)

## 2024-04-07 LAB — COMPREHENSIVE METABOLIC PANEL WITH GFR
ALT: 20 U/L (ref 0–44)
AST: 25 U/L (ref 15–41)
Albumin: 4 g/dL (ref 3.5–5.0)
Alkaline Phosphatase: 57 U/L (ref 38–126)
Anion gap: 6 (ref 5–15)
BUN: 11 mg/dL (ref 6–20)
CO2: 26 mmol/L (ref 22–32)
Calcium: 9 mg/dL (ref 8.9–10.3)
Chloride: 105 mmol/L (ref 98–111)
Creatinine, Ser: 0.7 mg/dL (ref 0.44–1.00)
GFR, Estimated: >60 mL/min (ref >60)
Glucose, Bld: 81 mg/dL (ref 70–99)
Potassium: 3.8 mmol/L (ref 3.5–5.1)
Sodium: 137 mmol/L (ref 135–145)
Total Bilirubin: 0.5 mg/dL (ref 0.0–1.2)
Total Protein: 7 g/dL (ref 6.5–8.1)

## 2024-04-07 LAB — CBC WITH DIFFERENTIAL/PLATELET
Abs Immature Granulocytes: 0.02 10*3/uL (ref 0.00–0.07)
Basophils Absolute: 0.1 10*3/uL (ref 0.0–0.1)
Basophils Relative: 1 %
Eosinophils Absolute: 0.3 10*3/uL (ref 0.0–0.5)
Eosinophils Relative: 4 %
HCT: 38.3 % (ref 36.0–46.0)
Hemoglobin: 12.3 g/dL (ref 12.0–15.0)
Immature Granulocytes: 0 %
Lymphocytes Relative: 38 %
Lymphs Abs: 3.1 10*3/uL (ref 0.7–4.0)
MCH: 27.5 pg (ref 26.0–34.0)
MCHC: 32.1 g/dL (ref 30.0–36.0)
MCV: 85.7 fL (ref 80.0–100.0)
Monocytes Absolute: 0.8 10*3/uL (ref 0.1–1.0)
Monocytes Relative: 10 %
Neutro Abs: 3.8 10*3/uL (ref 1.7–7.7)
Neutrophils Relative %: 47 %
Platelets: 279 10*3/uL (ref 150–400)
RBC: 4.47 MIL/uL (ref 3.87–5.11)
RDW: 13.2 % (ref 11.5–15.5)
WBC: 8 10*3/uL (ref 4.0–10.5)
nRBC: 0 % (ref 0.0–0.2)

## 2024-04-07 LAB — POC URINE PREG, ED: Preg Test, Ur: NEGATIVE

## 2024-04-07 MED ORDER — FLEET ENEMA RE ENEM: 1.0000 | ENEMA | Freq: Every day | RECTAL | 0 refills | Status: AC | PRN

## 2024-04-07 MED ORDER — MAGNESIUM CITRATE PO SOLN
1.0000 | Freq: Once | ORAL | Status: AC
  Administered 2024-04-07: 1 via ORAL
  Filled 2024-04-07: qty 296

## 2024-04-07 MED ORDER — IOHEXOL 300 MG/ML  SOLN
80.0000 mL | Freq: Once | INTRAMUSCULAR | Status: AC | PRN
  Administered 2024-04-07: 80 mL via INTRAVENOUS

## 2024-04-07 MED ORDER — KETOROLAC TROMETHAMINE 15 MG/ML IJ SOLN
15.0000 mg | Freq: Once | INTRAMUSCULAR | Status: AC
  Administered 2024-04-07: 15 mg via INTRAVENOUS
  Filled 2024-04-07: qty 1

## 2024-04-07 NOTE — Discharge Instructions (Addendum)
 Continue the miralax daily for the next 1-2 weeks.  Use the Fleet's enema up to twice.    Return to the ER for new, worsening or persistent severe constipation, abdominal pain, distention, vomiting, or any other new or worsening symptoms that concern you.

## 2024-04-07 NOTE — ED Notes (Signed)
 Collected urine specimen from pts room and sending to lab with temp sunquest label.

## 2024-04-12 ENCOUNTER — Other Ambulatory Visit: Payer: Self-pay

## 2024-04-12 ENCOUNTER — Encounter: Payer: Self-pay | Admitting: Emergency Medicine

## 2024-04-12 ENCOUNTER — Emergency Department
Admission: EM | Admit: 2024-04-12 | Discharge: 2024-04-12 | Disposition: A | Discharge status: Home / Self Care | Attending: Emergency Medicine | Admitting: Emergency Medicine

## 2024-04-12 DIAGNOSIS — E876 Hypokalemia: Secondary | ICD-10-CM | POA: Diagnosis not present

## 2024-04-12 DIAGNOSIS — R42 Dizziness and giddiness: Secondary | ICD-10-CM | POA: Insufficient documentation

## 2024-04-12 LAB — URINALYSIS, ROUTINE W REFLEX MICROSCOPIC
Bilirubin Urine: NEGATIVE
Glucose, UA: NEGATIVE mg/dL
Hgb urine dipstick: NEGATIVE
Ketones, ur: NEGATIVE mg/dL
Leukocytes,Ua: NEGATIVE
Nitrite: NEGATIVE
Protein, ur: NEGATIVE mg/dL
Specific Gravity, Urine: 1.028 (ref 1.005–1.030)
pH: 5 (ref 5.0–8.0)

## 2024-04-12 LAB — COMPREHENSIVE METABOLIC PANEL WITH GFR
ALT: 14 U/L (ref 0–44)
AST: 17 U/L (ref 15–41)
Albumin: 4.5 g/dL (ref 3.5–5.0)
Alkaline Phosphatase: 56 U/L (ref 38–126)
Anion gap: 8 (ref 5–15)
BUN: 8 mg/dL (ref 6–20)
CO2: 22 mmol/L (ref 22–32)
Calcium: 9.5 mg/dL (ref 8.9–10.3)
Chloride: 105 mmol/L (ref 98–111)
Creatinine, Ser: 0.7 mg/dL (ref 0.44–1.00)
GFR, Estimated: >60 mL/min (ref >60)
Glucose, Bld: 106 mg/dL — ABNORMAL HIGH (ref 70–99)
Potassium: 3.4 mmol/L — ABNORMAL LOW (ref 3.5–5.1)
Sodium: 135 mmol/L (ref 135–145)
Total Bilirubin: 0.7 mg/dL (ref 0.0–1.2)
Total Protein: 7.6 g/dL (ref 6.5–8.1)

## 2024-04-12 LAB — CBC
HCT: 39.3 % (ref 36.0–46.0)
Hemoglobin: 12.8 g/dL (ref 12.0–15.0)
MCH: 27.8 pg (ref 26.0–34.0)
MCHC: 32.6 g/dL (ref 30.0–36.0)
MCV: 85.2 fL (ref 80.0–100.0)
Platelets: 266 10*3/uL (ref 150–400)
RBC: 4.61 MIL/uL (ref 3.87–5.11)
RDW: 13.1 % (ref 11.5–15.5)
WBC: 10.3 10*3/uL (ref 4.0–10.5)
nRBC: 0 % (ref 0.0–0.2)

## 2024-04-12 LAB — POC URINE PREG, ED: Preg Test, Ur: NEGATIVE

## 2024-04-12 LAB — LIPASE, BLOOD: Lipase: 32 U/L (ref 11–51)

## 2024-04-12 MED ORDER — SODIUM CHLORIDE 0.9 % IV BOLUS
1000.0000 mL | Freq: Once | INTRAVENOUS | Status: AC
  Administered 2024-04-12: 1000 mL via INTRAVENOUS

## 2024-04-12 NOTE — ED Provider Notes (Signed)
 [facility] Provider Note    Event Date/Time   First MD Initiated Contact with Patient 04/12/24 1938     (approximate)   History   Nausea and Abdominal Pain   HPI  Jordan [name] is a 38 y.o. female with a history of anxiety who presents with lightheadedness and decreased appetite over the last several days.  The patient states that she initially was quite constipated and was seen in the ED last week.  She initially took lactulose , and then was given magnesium  citrate.  After taking the magnesium  citrate she had 1 day with about 6 watery bowel movements.  She had a couple of loose bowel movements the next day.  Subsequently, her stools have become more formed and regular.  However, she then started develop lightheadedness as well as some fatigue.  She reports nausea but no vomiting.  She also reports polyuria but no dysuria or hesitancy.  She has no fever or chills.  She has some left lower abdominal pain but this was present last week when she was constipated as well, it has not changed at all.  Reviewed the past medical records.  The patient was seen in the ED by me on 6/1 and previously the same day by another provider with constipation and abdominal pain.  She had a negative workup at that time.   Physical Exam   Triage Vital Signs: ED Triage Vitals  Encounter Vitals Group     BP 04/12/24 1913 118/67     Systolic BP Percentile --      Diastolic BP Percentile --      Pulse Rate 04/12/24 1913 79     Resp 04/12/24 1913 18     Temp 04/12/24 1913 98.2 F (36.8 C)     Temp Source 04/12/24 1913 Oral     SpO2 04/12/24 1913 100 %     Weight 04/12/24 1914 103 lb (46.7 kg)     Height 04/12/24 1914 5\' 2"  (1.575 m)     Head Circumference --      Peak Flow --      Pain Score --      Pain Loc --      Pain Education --      Exclude from Growth Chart --     Most recent vital signs: Vitals:   04/12/24 2038 04/12/24 2100  BP: 97/61 105/63  Pulse: 67 63   Resp:    Temp:    SpO2: 100% 100%     General: Alert, well-appearing, no distress.  CV:  Good peripheral perfusion.  Resp:  Normal effort.  Abd:  No distention.  Soft and nontender. Other:  Moist mucous membranes.  No jaundice or scleral icterus.   ED Results / Procedures / Treatments   Labs (all labs ordered are listed, but only abnormal results are displayed) Labs Reviewed  COMPREHENSIVE METABOLIC PANEL WITH GFR - Abnormal; Notable for the following components:      Result Value   Potassium 3.4 (*)    Glucose, Bld 106 (*)    All other components within normal limits  URINALYSIS, ROUTINE W REFLEX MICROSCOPIC - Abnormal; Notable for the following components:   Color, Urine YELLOW (*)    APPearance HAZY (*)    All other components within normal limits  LIPASE, BLOOD  CBC  POC URINE PREG, ED     EKG    RADIOLOGY    PROCEDURES:  Critical Care performed: No  Procedures  MEDICATIONS ORDERED IN ED: Medications  sodium chloride  0.9 % bolus 1,000 mL (0 mLs Intravenous Stopped 04/12/24 2105)     IMPRESSION / MDM / ASSESSMENT AND PLAN / ED COURSE  I reviewed the triage vital signs and the nursing notes.  38 year old female with PMH as noted above presents with lightheadedness, nausea, and oliguria for the last several days after being treated with magnesium  citrate for severe constipation.  She is no longer having loose or watery bowel movements.  On exam she is overall well-appearing.  Vital signs are normal.  Abdomen is soft and nontender.  Differential diagnosis includes, but is not limited to, dehydration, electrolyte abnormality, AKI, other metabolic cause, viral syndrome, UTI, other infection.  At this time, there is no indication for repeat imaging.  We will obtain labs, give a fluid bolus, and reassess.  Patient's presentation is most consistent with acute complicated illness / injury requiring diagnostic  workup.  ----------------------------------------- 9:23 PM on 04/12/2024 -----------------------------------------  CMP shows no electrolyte abnormalities and normal creatinine.  CBC shows no leukocytosis or other acute findings.  Urinalysis is negative.  The patient does not have any orthostatic hypotension.  She is feeling relatively well after fluids.  At this time, there is no indication for further ED workup or inpatient admission.  The patient is stable for discharge home.  I counseled her on the results of the workup and recommended PMD follow-up.  I gave strict return precautions, and she expressed understanding.  FINAL CLINICAL IMPRESSION(S) / ED DIAGNOSES   Final diagnoses:  Lightheadedness     Rx / DC Orders   ED Discharge Orders     None        Note:  This document was prepared using Dragon voice recognition software and may include unintentional dictation errors.    Lind Repine, MD 04/13/24 [phone]

## 2024-04-12 NOTE — ED Triage Notes (Signed)
 Arrived POV for abdominal pain, nausea, headaches, dizziness, low appetite since last week.  Per pt "I was seen here last week for constipation and was given something that starts with an L to help clean me out and I've been having these symptoms ever since."  A&Ox4, NADN

## 2024-04-12 NOTE — Discharge Instructions (Signed)
 Continue to follow up with your primary care doctor.  Return to the ER for new, worsening or persistent severe weakness or dizziness, low blood pressure, feeling like you are going to pass out, abdominal pain, vomiting or any other new or worsening symptoms that concern you.

## 2024-04-12 NOTE — ED Notes (Addendum)
 Pt presented to ED with c/o decreased appetite, decreased urinary output, dizziness, nausea, headaches. Was seen last week for constipation and states symptoms started after taking Mag Citrate given to her in the ED. Pt c/o LLQ abd pain radiating to back. Hx UTIs but denies pain/ burning with urination. Was seen by PCP yesterday and colonoscopy was ordered.

## 2024-07-04 ENCOUNTER — Emergency Department

## 2024-07-04 ENCOUNTER — Emergency Department
Admission: EM | Admit: 2024-07-04 | Discharge: 2024-07-04 | Disposition: A | Discharge status: Home / Self Care | Attending: Emergency Medicine | Admitting: Emergency Medicine

## 2024-07-04 ENCOUNTER — Other Ambulatory Visit: Payer: Self-pay

## 2024-07-04 DIAGNOSIS — J449 Chronic obstructive pulmonary disease, unspecified: Secondary | ICD-10-CM | POA: Insufficient documentation

## 2024-07-04 DIAGNOSIS — R079 Chest pain, unspecified: Secondary | ICD-10-CM | POA: Diagnosis present

## 2024-07-04 DIAGNOSIS — R0789 Other chest pain: Secondary | ICD-10-CM | POA: Diagnosis not present

## 2024-07-04 LAB — BASIC METABOLIC PANEL WITH GFR
Anion gap: 7 (ref 5–15)
BUN: 9 mg/dL (ref 6–20)
CO2: 23 mmol/L (ref 22–32)
Calcium: 8.7 mg/dL — ABNORMAL LOW (ref 8.9–10.3)
Chloride: 106 mmol/L (ref 98–111)
Creatinine, Ser: 0.81 mg/dL (ref 0.44–1.00)
GFR, Estimated: >60 mL/min (ref >60)
Glucose, Bld: 112 mg/dL — ABNORMAL HIGH (ref 70–99)
Potassium: 3.4 mmol/L — ABNORMAL LOW (ref 3.5–5.1)
Sodium: 136 mmol/L (ref 135–145)

## 2024-07-04 LAB — TROPONIN I (HIGH SENSITIVITY)
Troponin I (High Sensitivity): 3 ng/L (ref <18)
Troponin I (High Sensitivity): 3 ng/L (ref <18)

## 2024-07-04 LAB — CBC
HCT: 37.3 % (ref 36.0–46.0)
Hemoglobin: 11.9 g/dL — ABNORMAL LOW (ref 12.0–15.0)
MCH: 27.3 pg (ref 26.0–34.0)
MCHC: 31.9 g/dL (ref 30.0–36.0)
MCV: 85.6 fL (ref 80.0–100.0)
Platelets: 246 K/uL (ref 150–400)
RBC: 4.36 MIL/uL (ref 3.87–5.11)
RDW: 12.8 % (ref 11.5–15.5)
WBC: 7.2 K/uL (ref 4.0–10.5)
nRBC: 0 % (ref 0.0–0.2)

## 2024-07-04 MED ORDER — ACETAMINOPHEN 325 MG PO TABS
650.0000 mg | ORAL_TABLET | Freq: Once | ORAL | Status: AC
  Administered 2024-07-04: 650 mg via ORAL
  Filled 2024-07-04: qty 2

## 2024-07-04 MED ORDER — SODIUM CHLORIDE 0.9 % IV BOLUS
1000.0000 mL | Freq: Once | INTRAVENOUS | Status: AC
  Administered 2024-07-04: 1000 mL via INTRAVENOUS

## 2024-07-04 NOTE — ED Triage Notes (Signed)
 Pt to Ed via POV from home. Pt reports intermittent CP x3 days. Nausea yesterday. Pt reports pain worse today. Pt also reports hx of emphysema.

## 2024-07-04 NOTE — ED Notes (Signed)
 The pt's bp has been reassessed 3 times. I advised Dr. Dicky that the pt's bp has been running lower than her normal. The pt advised her normal bp runs in the 90's. The pt also is c/o dizziness.

## 2024-07-04 NOTE — ED Provider Notes (Signed)
 [facility] Provider Note    Event Date/Time   First MD Initiated Contact with Patient 07/04/24 0945     (approximate)   History   Chest Pain   HPI  Jordan [name] is a 38 y.o. female reports previous tubal ligation, recent diagnosis of COPD, and costochondritis.  She also has a lung nodule followed by her primary doctor.    Patient reports she has had previous tubal ligation surgery and is not pregnant.  She has not had recent surgeries or hospitalizations or trauma.  She has never had a blood clot.  She does not take any birth control or hormones.  She has not had any shortness of breath.  No leg swelling.   Patient reports that she has had pain intermittently in her chest wall under her left breast area for several years, the last 3 days it seems a little more sore and achy.  No swelling no shortness of breath.  No fevers or chills.  No cough no wheezing or difficulty breathing.  Physical Exam   Triage Vital Signs: ED Triage Vitals  Encounter Vitals Group     BP 07/04/24 0912 (!) 102/55     Girls Systolic BP Percentile --      Girls Diastolic BP Percentile --      Boys Systolic BP Percentile --      Boys Diastolic BP Percentile --      Pulse Rate 07/04/24 0912 (!) 56     Resp 07/04/24 0912 18     Temp 07/04/24 0912 98.4 F (36.9 C)     Temp Source 07/04/24 0912 Oral     SpO2 07/04/24 0912 98 %     Weight --      Height --      Head Circumference --      Peak Flow --      Pain Score 07/04/24 0913 5     Pain Loc --      Pain Education --      Exclude from Growth Chart --     Most recent vital signs: Vitals:   07/04/24 1300 07/04/24 1328  BP: (!) 90/46   Pulse: (!) 55   Resp: 15   Temp:  98.3 F (36.8 C)  SpO2: 100%      General: Awake, no distress.  Pleasant oriented.  Noted to have mild hypotension to moderate hypotension, [patient tells me that if I review her old records it her blood pressure is normally like around 90 when  she goes to the clinic on a good day] CV:  Good peripheral perfusion.  Normal tones and rate Resp:  Normal effort.  Clear bilateral Abd:  No distention.  Soft nontender Other:  No lower extreme edema venous cords or congestion  Left chest wall evaluated with paramedic Brandi.  The left breast area is not tender there is no edema, there is no overlying erythema.  Patient reports the area under the ribs here just feels sore when touched   ED Results / Procedures / Treatments   Labs (all labs ordered are listed, but only abnormal results are displayed) Labs Reviewed  BASIC METABOLIC PANEL WITH GFR - Abnormal; Notable for the following components:      Result Value   Potassium 3.4 (*)    Glucose, Bld 112 (*)    Calcium 8.7 (*)    All other components within normal limits  CBC - Abnormal; Notable for the following components:  Hemoglobin 11.9 (*)    All other components within normal limits  POC URINE PREG, ED  TROPONIN I (HIGH SENSITIVITY)  TROPONIN I (HIGH SENSITIVITY)     EKG  Inter by me at 920 heart rate 60 QRS 80 QTc 410 Normal sinus rhythm, minimal possible normal variant T wave inversion V2 V3 Compared to previous ECG no acute changes in morphology   RADIOLOGY  Chest x-ray inter by me is grossly normal  DG Chest 2 View Result Date: 07/04/2024 CLINICAL DATA:  38 year old female with intermittent chest pain for 3 days. Nausea. EXAM: CHEST - 2 VIEW COMPARISON:  Chest radiographs 04/06/2024 and earlier. FINDINGS: PA and lateral views 0930 hours. Lung volumes are chronically large, at the upper limits of normal to hyperinflated. Normal cardiac size and mediastinal contours. Visualized tracheal air column is within normal limits. Lung markings appear stable. No pneumothorax or pleural effusion. No confluent lung opacity. No acute osseous abnormality identified. Negative visible bowel gas. IMPRESSION: No acute cardiopulmonary abnormality possible chronic pulmonary  hyperinflation. Electronically Signed   By: VEAR Hurst M.D.   On: 07/04/2024 09:40       PROCEDURES:  Critical Care performed: No  Procedures   MEDICATIONS ORDERED IN ED: Medications  acetaminophen  (TYLENOL ) tablet 650 mg (650 mg Oral Given 07/04/24 1024)  sodium chloride  0.9 % bolus 1,000 mL (1,000 mLs Intravenous New Bag/Given 07/04/24 1243)     IMPRESSION / MDM / ASSESSMENT AND PLAN / ED COURSE  I reviewed the triage vital signs and the nursing notes.                              Differential diagnosis includes, but is not limited to, possible musculoskeletal recurrent costochondritis which she describes to have similar symptoms and an ongoing chronic component, ACS, PE pneumothorax COPD etc.  She has no findings suggestive of COPD PD exacerbation, infection, pneumonia, viral illness.  Chest pain very atypical for ACS.  No risk factors for thromboembolism and she is PERC negative  Patient's presentation is most consistent with acute complicated illness / injury requiring diagnostic workup.         Pulmonary Embolism Rule-out Criteria (PERC rule)                        If YES to ANY of the following, the PERC rule is not satisfied and cannot be used to rule out PE in this patient (consider d-dimer or imaging depending on pre-test probability).                      If NO to ALL of the following, AND the clinician's pre-test probability is <15%, the [facility] rule is satisfied and there is no need for further workup (including no need to obtain a d-dimer) as the post-test probability of pulmonary embolism is <2%.                      Mnemonic is HAD CLOTS   H - hormone use (exogenous estrogen)      No. A - age > 50                                                 No. D - DVT/PE history  No.   C - coughing blood (hemoptysis)                 No. L - leg swelling, unilateral                             No. O - O2 Sat on Room Air < 95%                   No. T - tachycardia (HR >= 100)                         No. S - surgery or trauma, recent                      No.   Based on my evaluation of the patient, including application of this decision instrument, further testing to evaluate for pulmonary embolism is not indicated at this time.      Clinical Course as of 07/04/24 1335  Fri Jul 04, 2024  1008 Exam of chest wall escorted by EMTP Brandi.  [MQ]  1316 Troponin normal x 2.  Patient resting comfortably.  Hypotension noted, but patient reports reliably that normally her blood pressure is quite low and I have reviewed some of her prior clinic notes and she does have a pre-existing history of chronic hypotension based on vital signs at those visits.  She is awake alert well-appearing.  Suspect costochondritis, she carries this diagnosis and symptoms today seem similar.  She will treat with aspirin and Tylenol , follow-up with PCP. [MQ]  1316 Return precautions and treatment recommendations and follow-up discussed with the patient who is agreeable with the plan.  [MQ]    Clinical Course User Index [MQ] Dicky Anes, MD   I was able to find 1 previous workup in January of this year where the patient was diagnosed with musculoskeletal chest pain possibly costochondritis.  Patient also reports that she suffered with similar condition now for about 6 or 7 years.  Return precautions and treatment recommendations and follow-up discussed with the patient who is agreeable with the plan.   FINAL CLINICAL IMPRESSION(S) / ED DIAGNOSES   Final diagnoses:  Left-sided chest wall pain     Rx / DC Orders   ED Discharge Orders     None        Note:  This document was prepared using Dragon voice recognition software and may include unintentional dictation errors.   Dicky Anes, MD 07/04/24 1335
